# Patient Record
Sex: Female | Born: 2000 | Hispanic: Yes | Marital: Married | Smoking: Never smoker
Health system: Southern US, Community
[De-identification: ages and names within clinical notes are randomized; demographics above are authoritative.]

## PROBLEM LIST (undated history)

## (undated) DIAGNOSIS — Z789 Other specified health status: Secondary | ICD-10-CM

## (undated) HISTORY — DX: Other specified health status: Z78.9

## (undated) HISTORY — PX: NO PAST SURGERIES: SHX2092

---
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
## 2020-03-08 ENCOUNTER — Other Ambulatory Visit: Payer: Self-pay

## 2020-03-08 ENCOUNTER — Ambulatory Visit
Admission: EM | Admit: 2020-03-08 | Discharge: 2020-03-08 | Disposition: A | Discharge status: Home / Self Care | Payer: Medicaid Other | Attending: Family Medicine | Admitting: Family Medicine

## 2020-03-08 DIAGNOSIS — B349 Viral infection, unspecified: Secondary | ICD-10-CM | POA: Diagnosis not present

## 2020-03-08 DIAGNOSIS — Z1152 Encounter for screening for COVID-19: Secondary | ICD-10-CM

## 2020-03-08 DIAGNOSIS — R112 Nausea with vomiting, unspecified: Secondary | ICD-10-CM

## 2020-03-08 MED ORDER — ONDANSETRON HCL 4 MG PO TABS: 4.0000 mg | ORAL_TABLET | Freq: Three times a day (TID) | ORAL | 0 refills | Status: DC | PRN

## 2020-03-08 MED ORDER — BENZONATATE 100 MG PO CAPS: 100.0000 mg | ORAL_CAPSULE | Freq: Three times a day (TID) | ORAL | 0 refills | Status: DC | PRN

## 2020-03-08 NOTE — ED Triage Notes (Signed)
Pt presents with c/o cough for past week, had some vomiting yesterday

## 2020-03-08 NOTE — Discharge Instructions (Addendum)
Hydrate well with fluids.  Benzonatate 3 times daily as needed for coughing.  Take Zofran every 8 hours as needed for nausea.  Tylenol and ibuprofen as needed for fever.  Your results will be available within 48 hours

## 2020-03-09 NOTE — ED Provider Notes (Signed)
[facility]    CSN: [id number] Arrival date & time: 03/08/20  1512      History   Chief Complaint Chief Complaint  Patient presents with  . Cough    HPI Heidi Dean is a 19 y.o. female.   HPI Patient presents today with viral symptoms if cough, nausea, subjective fever x 7 days. Concern that she has flu. Reports symptoms worsened after returning to work last night. She has not taken any medications for her symptoms. Reports vomiting mucus following an episode of coughing. Afebrile at present. Denies abdominal pain, SOB, wheezing, or chest pain. Past Medical History:  Diagnosis Date  . No pertinent past medical history     Patient Active Problem List   Diagnosis Date Noted  . Right upper quadrant pain 09/06/2018    Past Surgical History:  Procedure Laterality Date  . NO PAST SURGERIES      OB History   No obstetric history on file.      Home Medications    Prior to Admission medications   Medication Sig Start Date End Date Taking? Authorizing Provider  benzonatate (TESSALON) 100 MG capsule Take 1-2 capsules (100-200 mg total) by mouth 3 (three) times daily as needed for cough. 03/08/20   Bing Neighbors, FNP  ondansetron (ZOFRAN) 4 MG tablet Take 1 tablet (4 mg total) by mouth every 8 (eight) hours as needed for nausea or vomiting. 03/08/20   Bing Neighbors, FNP    Family History No family history on file.  Social History Social History   Tobacco Use  . Smoking status: Never Smoker  . Smokeless tobacco: Never Used  Substance Use Topics  . Alcohol use: Never  . Drug use: Never     Allergies   Patient has no known allergies.   Review of Systems Review of Systems Pertinent negatives listed in HPI  Physical Exam Triage Vital Signs ED Triage Vitals [03/08/20 1649]  Enc Vitals Group     BP 123/76     Pulse Rate 63     Resp 20     Temp 98.9 F (37.2 C)     Temp src      SpO2 98 %     Weight      Height       Head Circumference      Peak Flow      Pain Score 0     Pain Loc      Pain Edu?      Excl. in GC?    No data found.  Updated Vital Signs BP 123/76   Pulse 63   Temp 98.9 F (37.2 C)   Resp 20   LMP  (LMP Unknown)   SpO2 98%   Visual Acuity Right Eye Distance:   Left Eye Distance:   Bilateral Distance:    Right Eye Near:   Left Eye Near:    Bilateral Near:     Physical Exam General appearance: alert, well developed, well nourished Head: Normocephalic, without obvious abnormality, atraumatic ENT: External ears normal, nares patent, oropharynx normal Respiratory: Respirations even and unlabored, normal respiratory rate Heart: rate and rhythm normal. No gallop or murmurs noted on exam  Abdomen: BS +, no distention, no rebound tenderness, or no mass Extremities: No gross deformities Skin: Skin color, texture, turgor normal. No rashes seen  Psych: Appropriate mood and affect.  UC Treatments / Results  Labs (all labs ordered are listed, but only abnormal results are displayed) Labs Reviewed  COVID-19, FLU A+B NAA    EKG   Radiology No results found.  Procedures Procedures (including critical care time)  Medications Ordered in UC Medications - No data to display  Initial Impression / Assessment and Plan / UC Course  I have reviewed the triage vital signs and the nursing notes.  Pertinent labs & imaging results that were available during my care of the patient were reviewed by me and considered in my medical decision making (see chart for details).    Respiratory panel pending. Symptom management warranted only. Benzonatate PRN for cough and Zofran PRN. Quarantine until results are known. Final Clinical Impressions(s) / UC Diagnoses   Final diagnoses:  Viral illness  Nausea and vomiting, intractability of vomiting not specified, unspecified vomiting type     Discharge Instructions     Hydrate well with fluids.  Benzonatate 3 times daily as needed for  coughing.  Take Zofran every 8 hours as needed for nausea.  Tylenol and ibuprofen as needed for fever.  Your results will be available within 48 hours   ED Prescriptions    Medication Sig Dispense Auth. Provider   ondansetron (ZOFRAN) 4 MG tablet Take 1 tablet (4 mg total) by mouth every 8 (eight) hours as needed for nausea or vomiting. 10 tablet Bing Neighbors, FNP   benzonatate (TESSALON) 100 MG capsule Take 1-2 capsules (100-200 mg total) by mouth 3 (three) times daily as needed for cough. 40 capsule Bing Neighbors, FNP     PDMP not reviewed this encounter.   Bing Neighbors, FNP 03/09/20 [phone]

## 2020-03-11 LAB — COVID-19, FLU A+B NAA
Influenza A, NAA: NOT DETECTED
Influenza B, NAA: NOT DETECTED
SARS-CoV-2, NAA: NOT DETECTED

## 2021-03-30 IMAGING — CT CT ABDOMEN AND PELVIS WITH CONTRAST
2 of 4 series · 15 of 46 positions shown, 17 images · IV contrast (Isovue)
Comparison: None.

CLINICAL DATA: Periumbilical pain for the past 2 days.

EXAM:
CT ABDOMEN AND PELVIS WITH CONTRAST
TECHNIQUE: Multidetector CT imaging of the abdomen and pelvis was performed
using the standard protocol following bolus administration of
intravenous contrast.
CONTRAST:  100mL OMNIPAQUE IOHEXOL 300 MG/ML  SOLN

[Series 2: axial st · axial · 0.59mm/px · z∈[-552,-147]mm · 12 of 93 slices shown, 14 images]
[im 6/93  soft-tissue]
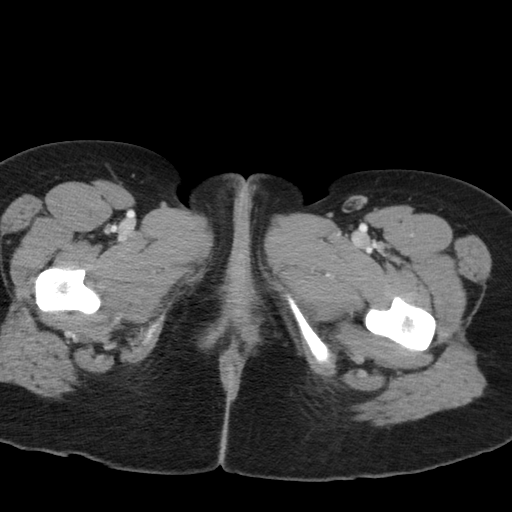
[im 6/93  bone]
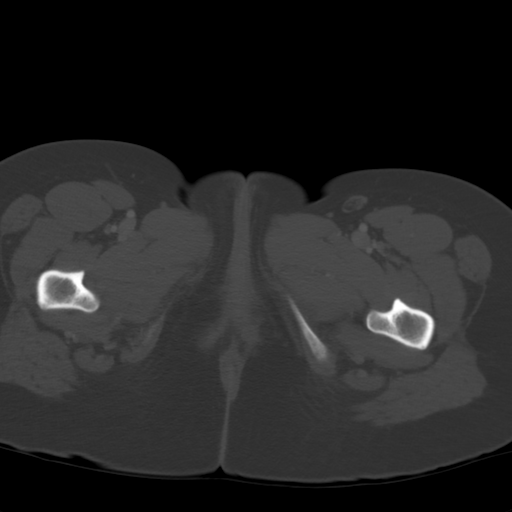
[im 17/93  soft-tissue]
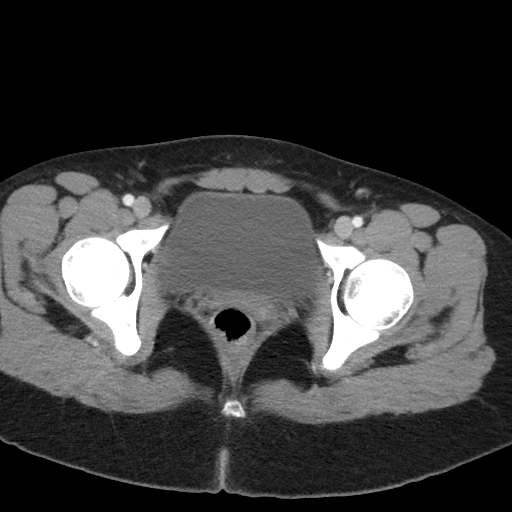
[im 22/93  soft-tissue]
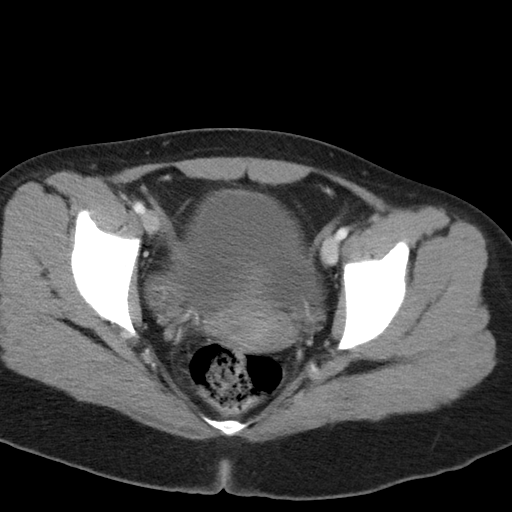
[im 28/93  soft-tissue]
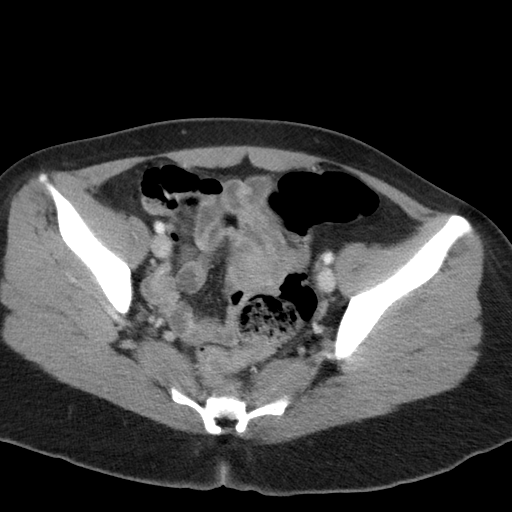
[im 38/93  soft-tissue]
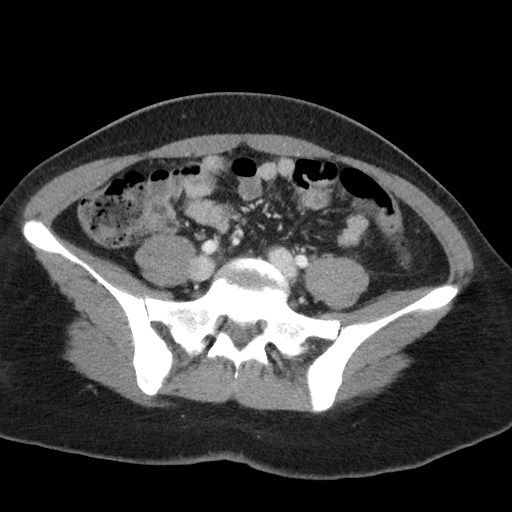
[im 44/93  soft-tissue]
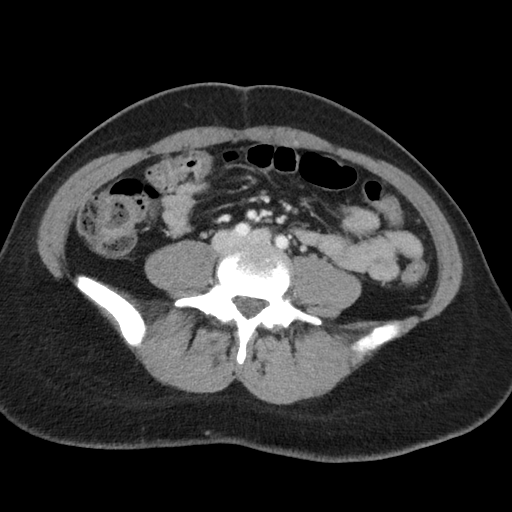
[im 49/93  soft-tissue]
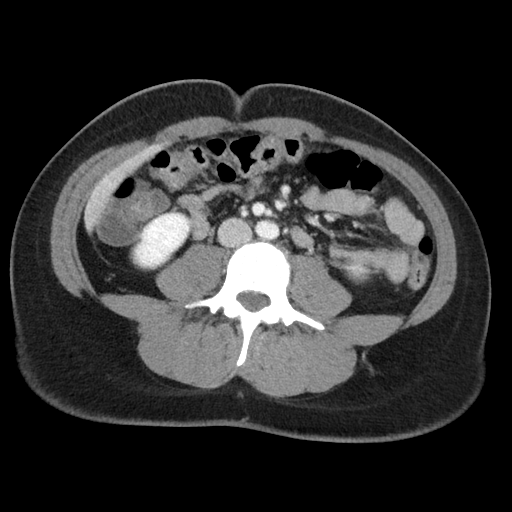
[im 60/93  soft-tissue]
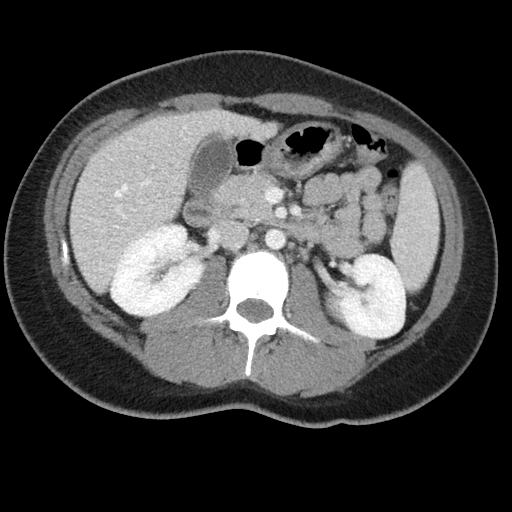
[im 65/93  soft-tissue]
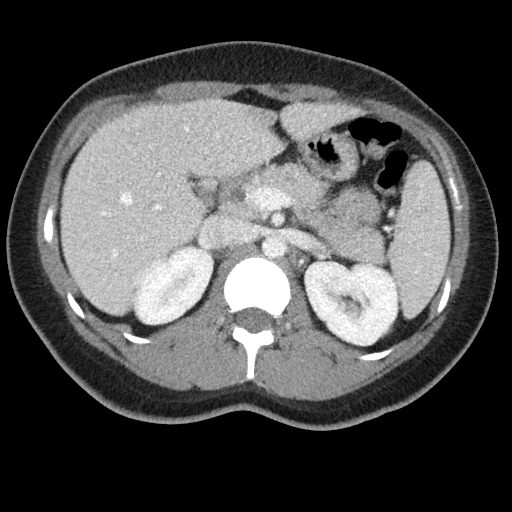
[im 65/93  bone]
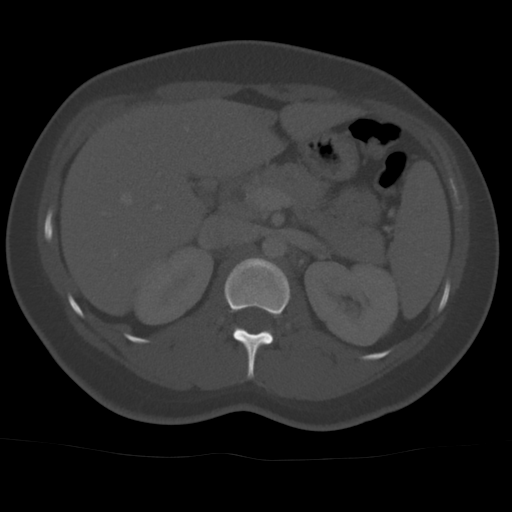
[im 71/93  soft-tissue]
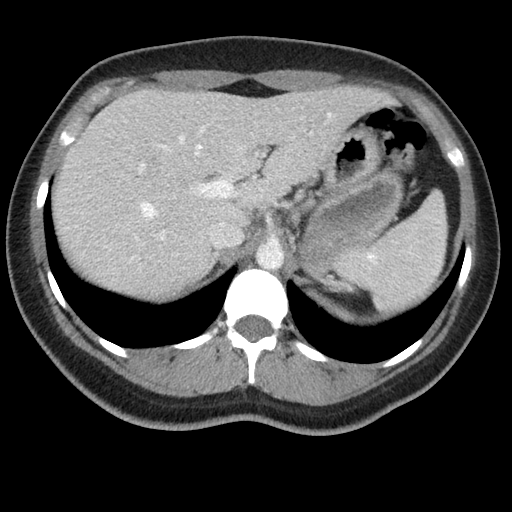
[im 82/93  soft-tissue]
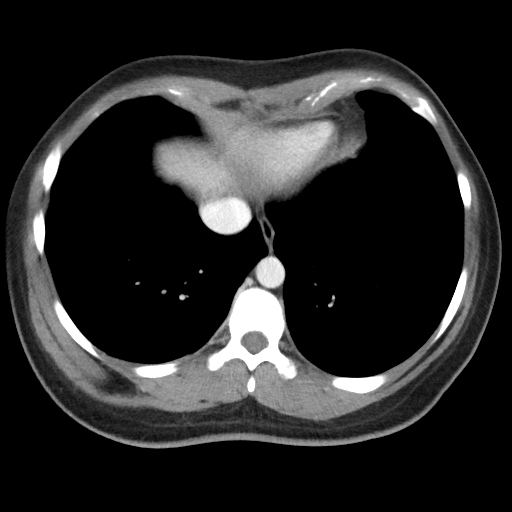
[im 87/93  soft-tissue]
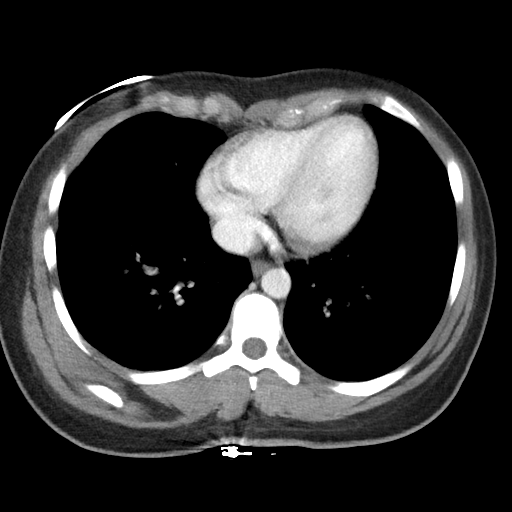

[Series 5: coronal st · coronal · 0.54mm/px · 3 of 71 slices shown]
[im 24/71  soft-tissue]
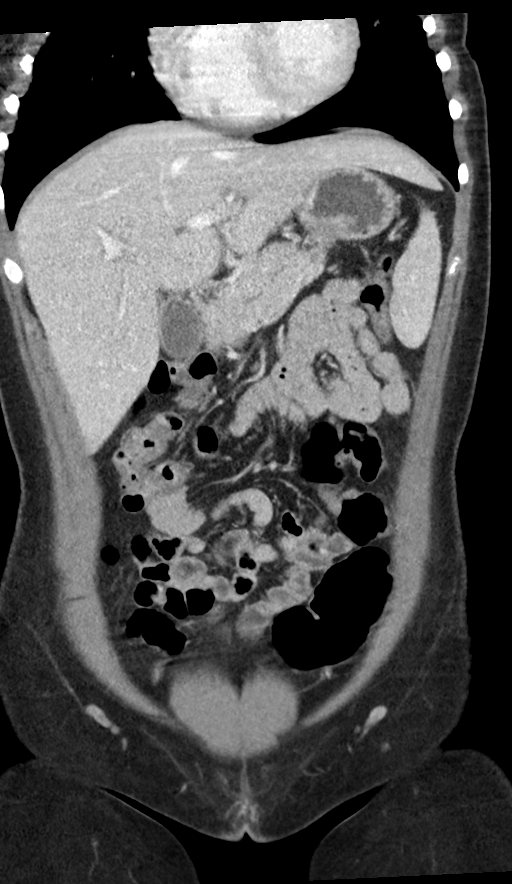
[im 32/71  soft-tissue]
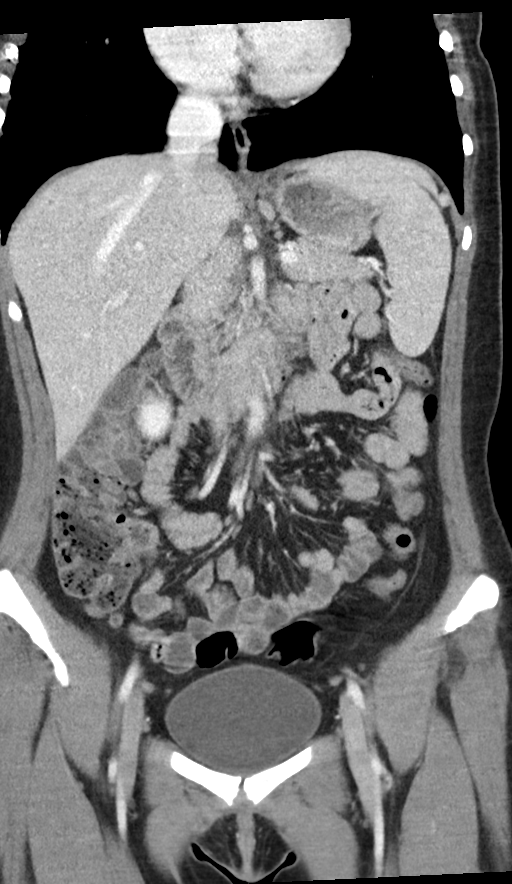
[im 39/71  soft-tissue]
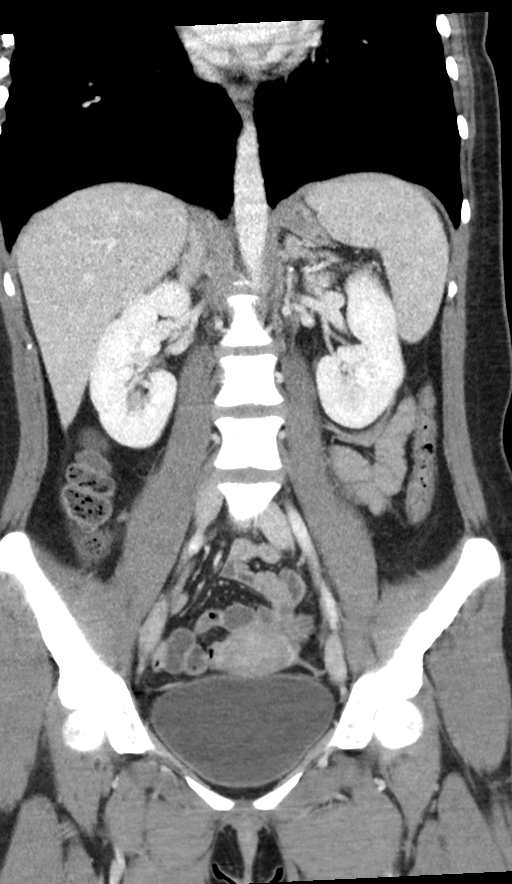

[15 of 46 positions shown; findings below may reference images not displayed]

FINDINGS: Lower chest: No acute abnormality.

Hepatobiliary: No focal liver abnormality. The gallbladder wall is
at the upper limits of normal in thickness. No gallstones,
pericholecystic inflammatory changes, or biliary dilatation.

Pancreas: Unremarkable. No pancreatic ductal dilatation or
surrounding inflammatory changes.

Spleen: Normal in size without focal abnormality.

Adrenals/Urinary Tract: Adrenal glands are unremarkable. Kidneys are
normal, without renal calculi, focal lesion, or hydronephrosis.
Bladder is unremarkable.

Stomach/Bowel: Stomach is within normal limits. Appendix appears
normal. No evidence of bowel wall thickening, distention, or
inflammatory changes.

Vascular/Lymphatic: No significant vascular findings are present. No
enlarged abdominal or pelvic lymph nodes.

Reproductive: Uterus and bilateral adnexa are unremarkable.

Other: No abdominal wall hernia or abnormality. No abdominopelvic
ascites. No pneumoperitoneum.

Musculoskeletal: No acute or significant osseous findings.
IMPRESSION: 1.  No acute intra-abdominal process.

## 2021-03-30 IMAGING — US ULTRASOUND ABDOMEN LIMITED
1 series · 14 of 25 positions shown · non-contrast
Comparison: CT abdomen and pelvis September 04, 2018

CLINICAL DATA: Elevated liver enzymes

EXAM:
ULTRASOUND ABDOMEN LIMITED RIGHT UPPER QUADRANT

[Series 1: ultrasound abdomen limited · 14 of 38 slices shown]
[im 1/38]
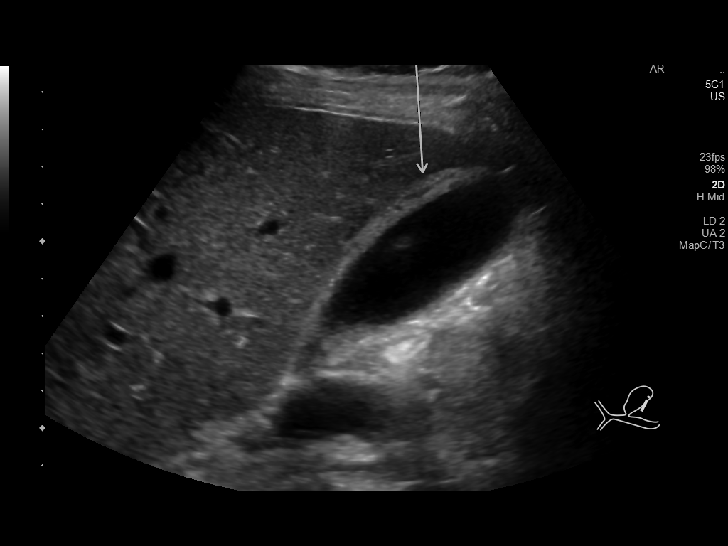
[im 4/38]
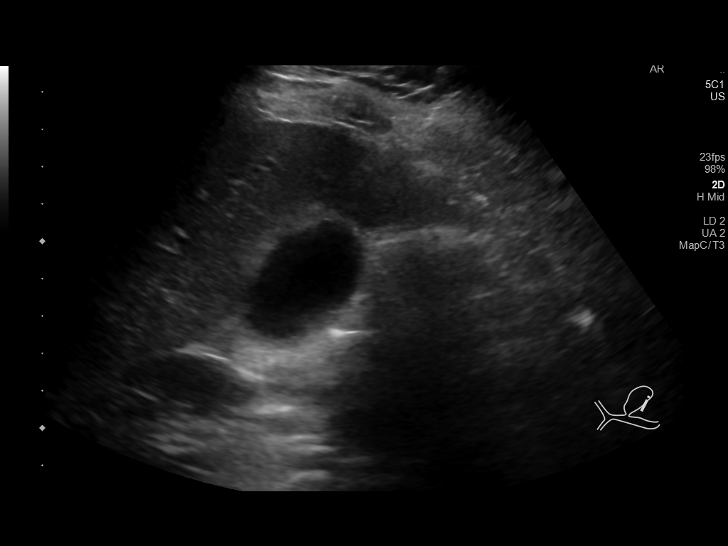
[im 7/38]
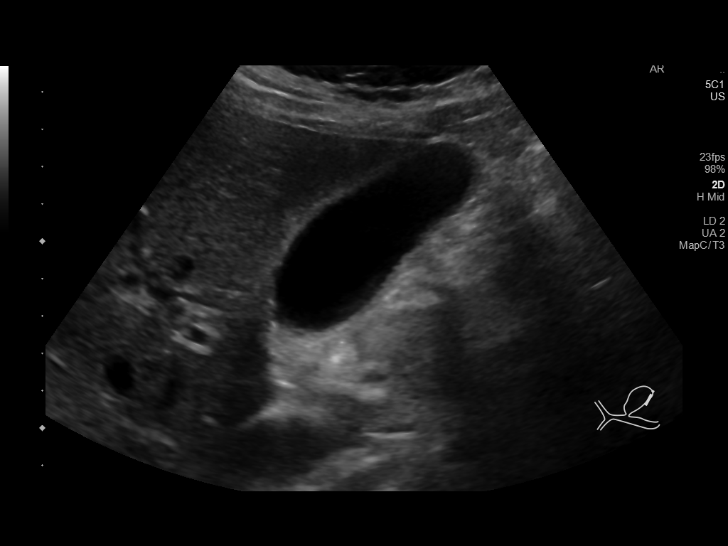
[im 10/38]
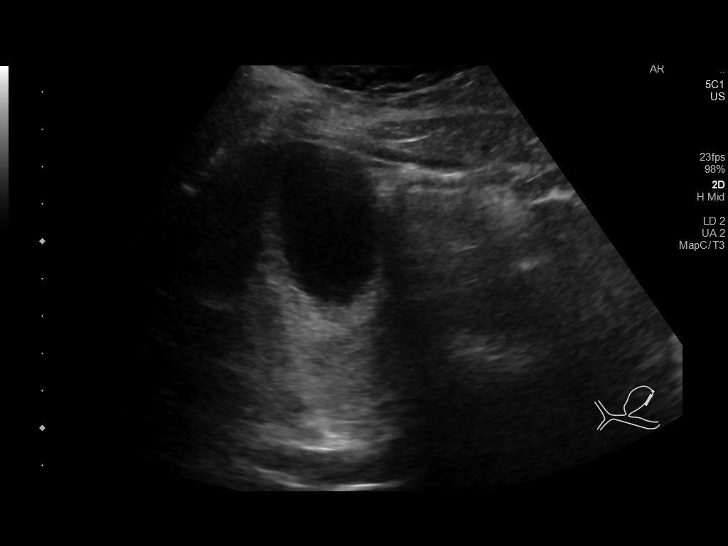
[im 13/38]
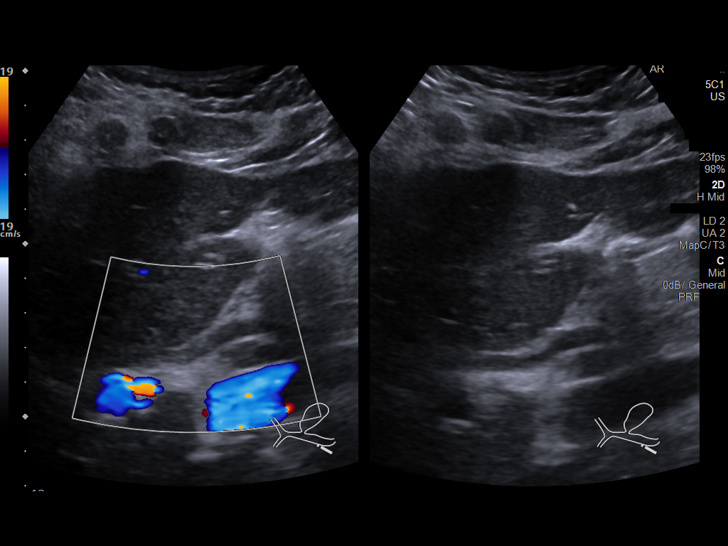
[im 14/38]
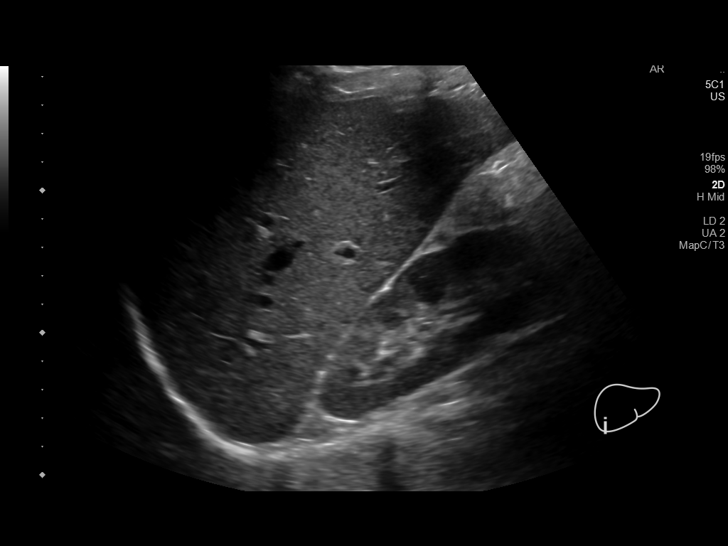
[im 17/38]
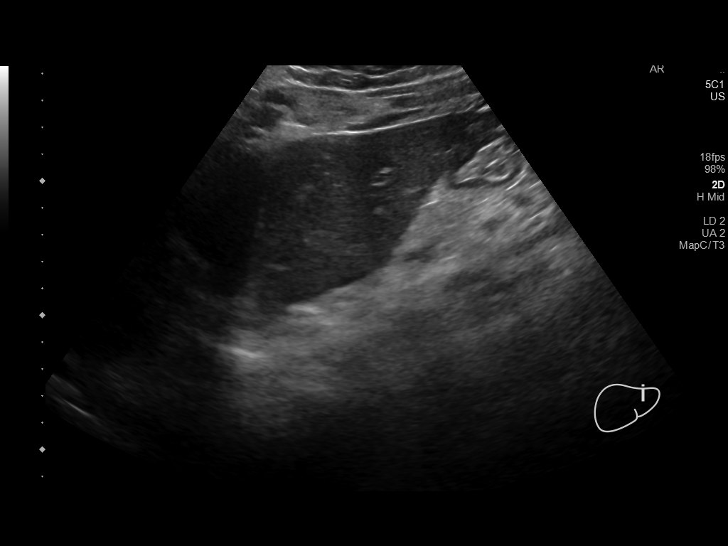
[im 21/38]
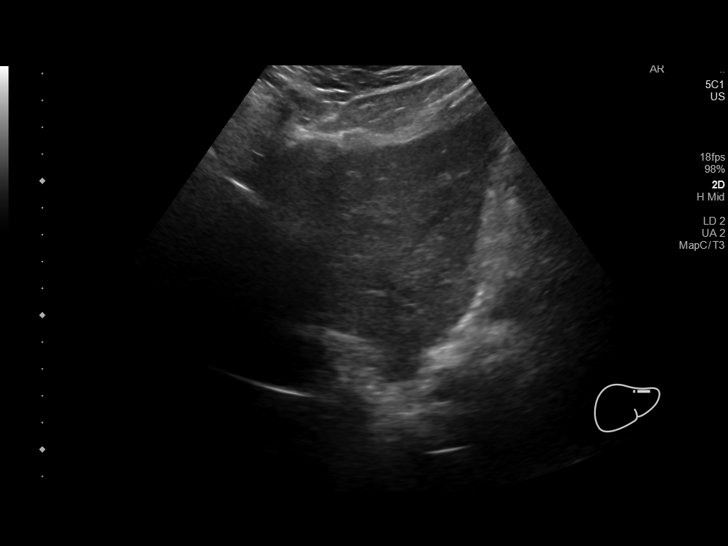
[im 24/38]
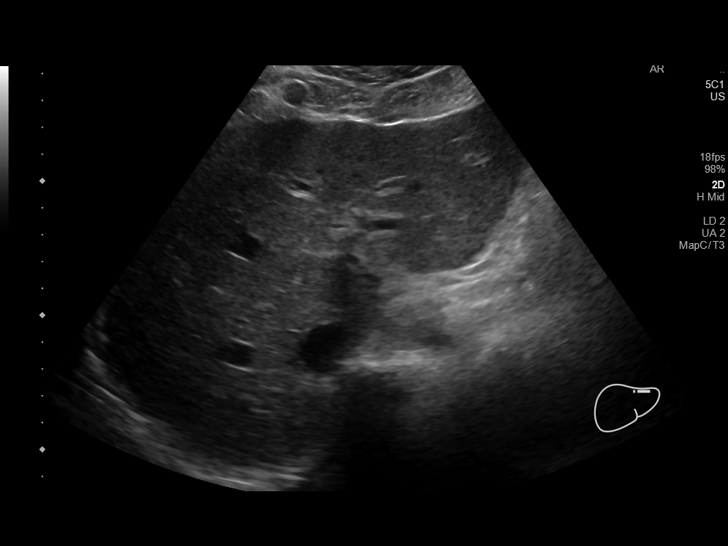
[im 25/38]
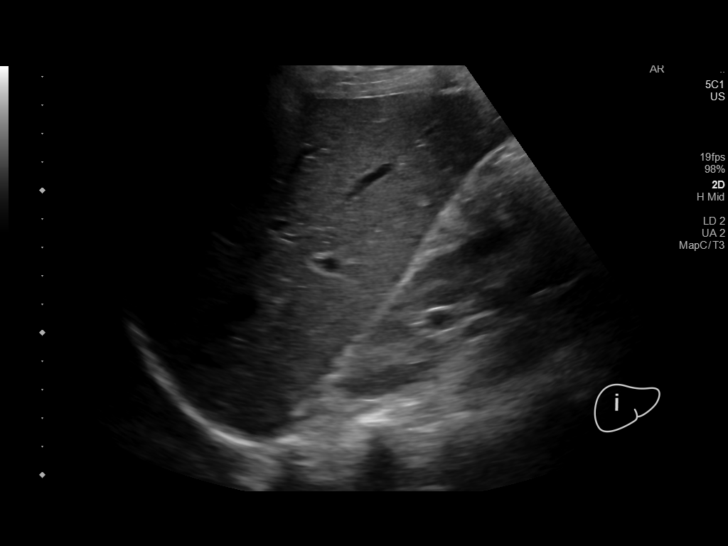
[im 28/38]
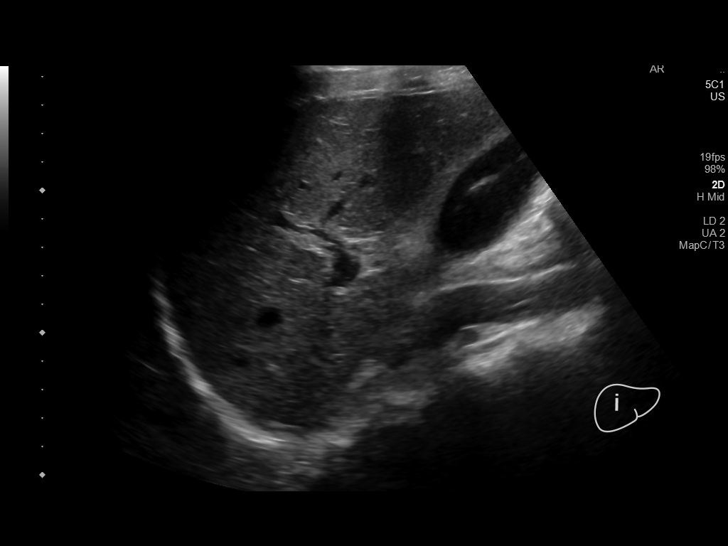
[im 31/38]
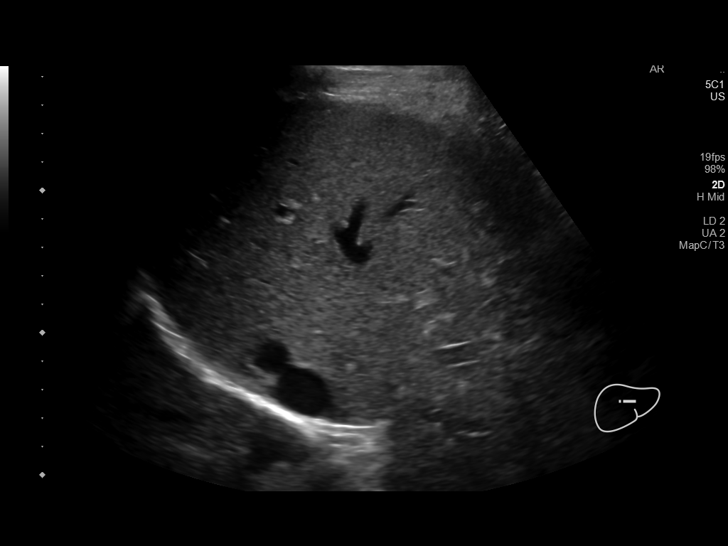
[im 34/38]
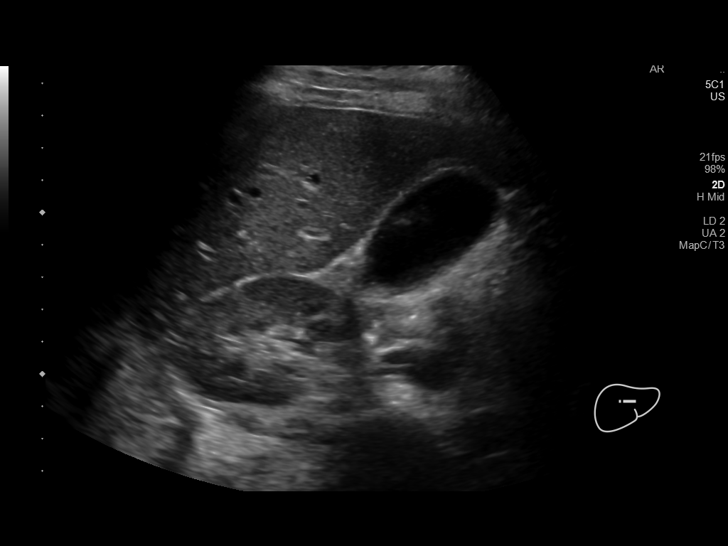
[im 38/38]
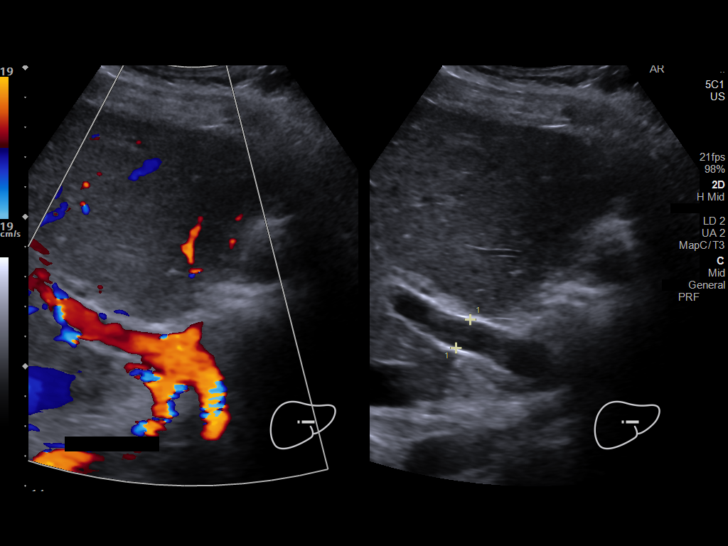

[14 of 25 positions shown; findings below may reference images not displayed]

FINDINGS: Gallbladder:

No gallstones evident. There is gallbladder wall thickening with
mild edema in the gallbladder wall. There is slight pericholecystic
fluid. No sonographic Murphy sign noted by sonographer.

Common bile duct:

Diameter: 6 mm. No intrahepatic or extrahepatic biliary duct
dilatation.

Liver:

No focal lesion identified. Within normal limits in parenchymal
echogenicity. Portal vein is patent on color Doppler imaging with
normal direction of blood flow towards the liver.
IMPRESSION: Thickened gallbladder wall with mild edema and mild pericholecystic
fluid. No gallstones evident. The appearance is concerning for
acalculus cholecystitis. It may be prudent to consider nuclear
medicine hepatobiliary imaging study to assess for cystic duct
patency.

Study otherwise unremarkable.

## 2021-04-15 IMAGING — NM NUCLEAR MEDICINE HEPATOBILIARY IMAGING WITH GALLBLADDER EF
2 series · 12 of 12 positions shown · non-contrast
Comparison: None.

CLINICAL DATA: Right upper quadrant pain

EXAM:
NUCLEAR MEDICINE HEPATOBILIARY IMAGING WITH GALLBLADDER EF
VIEWS:
Anterior right upper quadrant
RADIOPHARMACEUTICALS:  5.4 mCi Hc-TTm  Choletec IV

[Series 1: biliary · 3.25mm/px · 6 of 60 frames shown]
[frame 6/60]
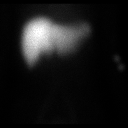
[frame 16/60]
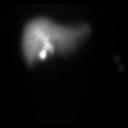
[frame 26/60]
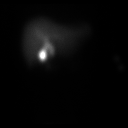
[frame 36/60]
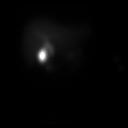
[frame 46/60]
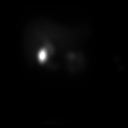
[frame 56/60]
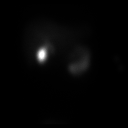

[Series 2: gbef · 3.25mm/px · 6 of 60 frames shown]
[frame 6/60]
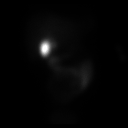
[frame 16/60]
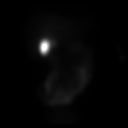
[frame 26/60]
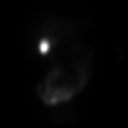
[frame 36/60]
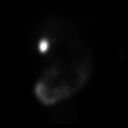
[frame 46/60]
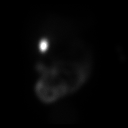
[frame 56/60]
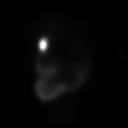

[12 of 12 positions shown; findings below may reference images not displayed]

FINDINGS: Liver uptake of radiotracer is unremarkable. There is prompt
visualization of gallbladder and small bowel, indicating patency of
the cystic and common bile ducts. The patient consumed 8 ounces of
Ensure orally with calculation of the computer generated ejection
fraction of radiotracer from the gallbladder. The patient did not
experience clinical symptoms with the oral Ensure consumption. The
computer generated ejection fraction of radiotracer from the
gallbladder is abnormally low at 30%, normal greater than 33% using
the oral agent.
IMPRESSION: Abnormally low ejection fraction of radiotracer from the
gallbladder, a finding concerning for a degree of biliary
dyskinesia. Cystic and common bile ducts are patent as is evidenced
by visualization of gallbladder and small bowel.

## 2021-08-24 ENCOUNTER — Ambulatory Visit
Admission: EM | Admit: 2021-08-24 | Discharge: 2021-08-24 | Disposition: A | Discharge status: Home / Self Care | Payer: Medicaid Other | Attending: Family Medicine | Admitting: Family Medicine

## 2021-08-24 DIAGNOSIS — J209 Acute bronchitis, unspecified: Secondary | ICD-10-CM

## 2021-08-24 MED ORDER — PROMETHAZINE-DM 6.25-15 MG/5ML PO SYRP: 5.0000 mL | ORAL_SOLUTION | Freq: Four times a day (QID) | ORAL | 0 refills | Status: DC | PRN

## 2021-08-24 MED ORDER — PREDNISONE 20 MG PO TABS: 40.0000 mg | ORAL_TABLET | Freq: Every day | ORAL | 0 refills | Status: DC

## 2021-08-24 MED ORDER — ALBUTEROL SULFATE HFA 108 (90 BASE) MCG/ACT IN AERS: 1.0000 | INHALATION_SPRAY | Freq: Four times a day (QID) | RESPIRATORY_TRACT | 0 refills | Status: DC | PRN

## 2021-08-24 NOTE — ED Provider Notes (Addendum)
[facility]    CSN: [id number] Arrival date & time: 08/24/21  1429      History   Chief Complaint Chief Complaint  Patient presents with   Cough    HPI Heidi Dean is a 21 y.o. female.   Medical interpreter declined today, family member who is with her wishes to translate for her with her consent.  She states she has had cold type symptoms for about a week now, those have all resolved but she is left with a hacking persistent cough.  She denies wheezing, chest pain, shortness of breath, fever, chills, abdominal pain, nausea vomiting or diarrhea.  She has been trying DayQuil, NyQuil and some naturopathic cough medicine from [place] with no relief.  No known history of chronic pulmonary disease.   Past Medical History:  Diagnosis Date   No pertinent past medical history     Patient Active Problem List   Diagnosis Date Noted   Right upper quadrant pain 09/06/2018    Past Surgical History:  Procedure Laterality Date   NO PAST SURGERIES      OB History   No obstetric history on file.      Home Medications    Prior to Admission medications   Medication Sig Start Date End Date Taking? Authorizing Provider  albuterol (VENTOLIN HFA) 108 (90 Base) MCG/ACT inhaler Inhale 1-2 puffs into the lungs every 6 (six) hours as needed for wheezing or shortness of breath. 08/24/21  Yes Volney American, PA-C  predniSONE (DELTASONE) 20 MG tablet Take 2 tablets (40 mg total) by mouth daily with breakfast. 08/24/21  Yes Volney American, PA-C  promethazine-dextromethorphan (PROMETHAZINE-DM) 6.25-15 MG/5ML syrup Take 5 mLs by mouth 4 (four) times daily as needed. 08/24/21  Yes Volney American, PA-C  benzonatate (TESSALON) 100 MG capsule Take 1-2 capsules (100-200 mg total) by mouth 3 (three) times daily as needed for cough. 03/08/20   Scot Jun, FNP  ondansetron (ZOFRAN) 4 MG tablet Take 1 tablet (4 mg total) by mouth every 8 (eight) hours as  needed for nausea or vomiting. 03/08/20   Scot Jun, FNP    Family History No family history on file.  Social History Social History   Tobacco Use   Smoking status: Never   Smokeless tobacco: Never  Vaping Use   Vaping Use: Never used  Substance Use Topics   Alcohol use: Yes   Drug use: Never     Allergies   Patient has no known allergies.   Review of Systems Review of Systems Per HPI  Physical Exam Triage Vital Signs ED Triage Vitals  Enc Vitals Group     BP 08/24/21 1442 122/80     Pulse Rate 08/24/21 1442 81     Resp 08/24/21 1442 20     Temp 08/24/21 1442 98.8 F (37.1 C)     Temp Source 08/24/21 1442 Oral     SpO2 08/24/21 1442 98 %     Weight --      Height --      Head Circumference --      Peak Flow --      Pain Score 08/24/21 1441 0     Pain Loc --      Pain Edu? --      Excl. in Nitro? --    No data found.  Updated Vital Signs BP 122/80 (BP Location: Right Arm)   Pulse 81   Temp 98.8 F (37.1 C) (Oral)  Resp 20   LMP 07/14/2021 (Approximate)   SpO2 98%   Visual Acuity Right Eye Distance:   Left Eye Distance:   Bilateral Distance:    Right Eye Near:   Left Eye Near:    Bilateral Near:     Physical Exam Vitals and nursing note reviewed.  Constitutional:      Appearance: Normal appearance.  HENT:     Head: Atraumatic.     Right Ear: Tympanic membrane and external ear normal.     Left Ear: Tympanic membrane and external ear normal.     Nose: Nose normal.     Mouth/Throat:     Mouth: Mucous membranes are moist.     Pharynx: Posterior oropharyngeal erythema present.  Eyes:     Extraocular Movements: Extraocular movements intact.     Conjunctiva/sclera: Conjunctivae normal.  Cardiovascular:     Rate and Rhythm: Normal rate and regular rhythm.     Heart sounds: Normal heart sounds.  Pulmonary:     Effort: Pulmonary effort is normal.     Breath sounds: Normal breath sounds. No wheezing or rales.  Musculoskeletal:         General: Normal range of motion.     Cervical back: Normal range of motion and neck supple.  Skin:    General: Skin is warm and dry.  Neurological:     Mental Status: She is alert and oriented to person, place, and time.     Motor: No weakness.     Gait: Gait normal.  Psychiatric:        Mood and Affect: Mood normal.        Thought Content: Thought content normal.   UC Treatments / Results  Labs (all labs ordered are listed, but only abnormal results are displayed) Labs Reviewed - No data to display  EKG  Radiology No results found.  Procedures Procedures (including critical care time)  Medications Ordered in UC Medications - No data to display  Initial Impression / Assessment and Plan / UC Course  I have reviewed the triage vital signs and the nursing notes.  Pertinent labs & imaging results that were available during my care of the patient were reviewed by me and considered in my medical decision making (see chart for details).     Vital signs and exam overall very reassuring today.  Suspect postviral bronchitis.  Treat with prednisone, albuterol, Phenergan DM and continue supportive over-the-counter remedies.  Return for acutely worsening symptoms.  When asked, she declines any chance of pregnancy.  Final Clinical Impressions(s) / UC Diagnoses   Final diagnoses:  Acute bronchitis, unspecified organism   Discharge Instructions   None    ED Prescriptions     Medication Sig Dispense Auth. Provider   predniSONE (DELTASONE) 20 MG tablet Take 2 tablets (40 mg total) by mouth daily with breakfast. 6 tablet Volney American, PA-C   promethazine-dextromethorphan (PROMETHAZINE-DM) 6.25-15 MG/5ML syrup Take 5 mLs by mouth 4 (four) times daily as needed. 100 mL Volney American, PA-C   albuterol (VENTOLIN HFA) 108 (90 Base) MCG/ACT inhaler Inhale 1-2 puffs into the lungs every 6 (six) hours as needed for wheezing or shortness of breath. 18 g Volney American, [place]      PDMP not reviewed this encounter.   Volney American, [place] 08/24/21 [place], [place], [place] 08/24/21 [phone]

## 2021-08-24 NOTE — ED Triage Notes (Signed)
Pt states she started coughing and head cold last Thursday  Pt states she is better now but cant get rid of the cough  Pt states she tried OTC meds but no relief from the cough  Denies Fever

## 2022-10-31 ENCOUNTER — Ambulatory Visit
Admission: EM | Admit: 2022-10-31 | Discharge: 2022-10-31 | Disposition: A | Discharge status: Home / Self Care | Payer: Medicaid Other | Attending: Family Medicine | Admitting: Family Medicine

## 2022-10-31 DIAGNOSIS — J069 Acute upper respiratory infection, unspecified: Secondary | ICD-10-CM | POA: Diagnosis not present

## 2022-10-31 DIAGNOSIS — N926 Irregular menstruation, unspecified: Secondary | ICD-10-CM | POA: Diagnosis not present

## 2022-10-31 MED ORDER — ALBUTEROL SULFATE HFA 108 (90 BASE) MCG/ACT IN AERS: 2.0000 | INHALATION_SPRAY | RESPIRATORY_TRACT | 0 refills | Status: DC | PRN

## 2022-10-31 MED ORDER — PROMETHAZINE-DM 6.25-15 MG/5ML PO SYRP: 5.0000 mL | ORAL_SOLUTION | Freq: Four times a day (QID) | ORAL | 0 refills | Status: DC | PRN

## 2022-10-31 NOTE — ED Provider Notes (Signed)
[facility]    CSN: [id number] Arrival date & time: 10/31/22  4098      History   Chief Complaint No chief complaint on file.   HPI Heidi Dean is a 22 y.o. female.   Presenting today with 2-day history of cough, sinus drainage.  Denies fever, chills, chest pain, shortness of breath, abdominal pain, nausea vomiting or diarrhea.  States she sometimes get into coughing fits and finds it hard to catch her breath.  History of bronchitis, no known history of asthma or smoking.  So far trying DayQuil and NyQuil with only mild relief.  No known sick contacts recently.  She is also complaining of irregular periods the past 3 months.  States her periods are coming on time, but she is having a second 4 to 5-day stretch of bleeding about halfway through her cycles which has never happened to her before.  Bleeding is not particularly heavy, no significant clots or pelvic pain.  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  Does not have an OB/GYN.  Not on any contraceptives but not concern for pregnancy today and does not wish to check for this.    Past Medical History:  Diagnosis Date   No pertinent past medical history     Patient Active Problem List   Diagnosis Date Noted   Right upper quadrant pain 09/06/2018    Past Surgical History:  Procedure Laterality Date   NO PAST SURGERIES      OB History   No obstetric history on file.      Home Medications    Prior to Admission medications   Medication Sig Start Date End Date Taking? Authorizing Provider  albuterol (VENTOLIN HFA) 108 (90 Base) MCG/ACT inhaler Inhale 2 puffs into the lungs every 4 (four) hours as needed for wheezing or shortness of breath. 10/31/22  Yes Particia Nearing, PA-C  promethazine-dextromethorphan (PROMETHAZINE-DM) 6.25-15 MG/5ML syrup Take 5 mLs by mouth 4 (four) times daily as needed. 10/31/22  Yes Particia Nearing,  PA-C  albuterol (VENTOLIN HFA) 108 (90 Base) MCG/ACT inhaler Inhale 1-2 puffs into the lungs every 6 (six) hours as needed for wheezing or shortness of breath. 08/24/21   Particia Nearing, PA-C  benzonatate (TESSALON) 100 MG capsule Take 1-2 capsules (100-200 mg total) by mouth 3 (three) times daily as needed for cough. 03/08/20   Bing Neighbors, NP  ondansetron (ZOFRAN) 4 MG tablet Take 1 tablet (4 mg total) by mouth every 8 (eight) hours as needed for nausea or vomiting. 03/08/20   Bing Neighbors, NP  predniSONE (DELTASONE) 20 MG tablet Take 2 tablets (40 mg total) by mouth daily with breakfast. 08/24/21   Particia Nearing, PA-C  promethazine-dextromethorphan (PROMETHAZINE-DM) 6.25-15 MG/5ML syrup Take 5 mLs by mouth 4 (four) times daily as needed. 08/24/21   Particia Nearing, PA-C    Family History History reviewed. No pertinent family history.  Social History Social History   Tobacco Use   Smoking status: Never   Smokeless tobacco: Never  Vaping Use   Vaping status: Never Used  Substance Use Topics   Alcohol use: Yes   Drug use: Never     Allergies   Patient has no known allergies.   Review of Systems Review of Systems Per HPI  Physical Exam Triage Vital Signs ED Triage Vitals [10/31/22 1000]  Encounter Vitals Group     BP 116/78  Systolic BP Percentile      Diastolic BP Percentile      Pulse Rate 76     Resp 18     Temp 98.5 F (36.9 C)     Temp Source Oral     SpO2 98 %     Weight      Height      Head Circumference      Peak Flow      Pain Score 0     Pain Loc      Pain Education      Exclude from Growth Chart    No data found.  Updated Vital Signs BP 116/78 (BP Location: Right Arm)   Pulse 76   Temp 98.5 F (36.9 C) (Oral)   Resp 18   LMP 10/18/2022 (Approximate)   SpO2 98%   Visual Acuity Right Eye Distance:   Left Eye Distance:   Bilateral Distance:    Right Eye Near:   Left Eye Near:    Bilateral Near:      Physical Exam Vitals and nursing note reviewed.  Constitutional:      Appearance: Normal appearance. She is not ill-appearing.  HENT:     Head: Atraumatic.     Nose: Nose normal.     Mouth/Throat:     Mouth: Mucous membranes are moist.     Pharynx: Oropharynx is clear.  Eyes:     Extraocular Movements: Extraocular movements intact.     Conjunctiva/sclera: Conjunctivae normal.  Cardiovascular:     Rate and Rhythm: Normal rate and regular rhythm.     Heart sounds: Normal heart sounds.  Pulmonary:     Effort: Pulmonary effort is normal. No respiratory distress.     Breath sounds: Normal breath sounds. No wheezing or rales.  Abdominal:     General: Bowel sounds are normal. There is no distension.     Palpations: Abdomen is soft.     Tenderness: There is no abdominal tenderness. There is no right CVA tenderness, left CVA tenderness or guarding.  Genitourinary:    Comments: GU exam deferred with shared decision making Musculoskeletal:        General: Normal range of motion.     Cervical back: Normal range of motion and neck supple.  Skin:    General: Skin is warm and dry.  Neurological:     Mental Status: She is alert and oriented to person, place, and time.  Psychiatric:        Mood and Affect: Mood normal.        Thought Content: Thought content normal.        Judgment: Judgment normal.      UC Treatments / Results  Labs (all labs ordered are listed, but only abnormal results are displayed) Labs Reviewed - No data to display  EKG   Radiology No results found.  Procedures Procedures (including critical care time)  Medications Ordered in UC Medications - No data to display  Initial Impression / Assessment and Plan / UC Course  I have reviewed the triage vital signs and the nursing notes.  Pertinent labs & imaging results that were available during my care of the patient were reviewed by me and considered in my medical decision making (see chart for  details).     Will provide albuterol inhaler and cough syrup to help with coughing fits and discussed supportive over-the-counter medications, home care additionally.  No red flag findings on exam for this today.  Regarding her  abnormal menses, recommended OB/GYN resources for follow-up.  Bleeding is controlled so we will forego CBC today or starting medication to halt extra bleeding.  Discussed follow-up precautions for uncontrolled episodes of bleeding, weakness, dizziness associated Final Clinical Impressions(s) / UC Diagnoses   Final diagnoses:  Viral URI with cough  Abnormal menstruation     Discharge Instructions      I have sent over an albuterol inhaler and cough syrup to help with your cough symptoms.  You may also continue taking DayQuil, NyQuil, Mucinex and staying well-hydrated.  Regarding your abnormal periods, I have placed resources for a local OB/GYN clinic that you may call to schedule a new patient appointment.    ED Prescriptions     Medication Sig Dispense Auth. Provider   albuterol (VENTOLIN HFA) 108 (90 Base) MCG/ACT inhaler Inhale 2 puffs into the lungs every 4 (four) hours as needed for wheezing or shortness of breath. 18 g Roosvelt Maser [place], [place]   promethazine-dextromethorphan (PROMETHAZINE-DM) 6.25-15 MG/5ML syrup Take 5 mLs by mouth 4 (four) times daily as needed. 100 mL Particia Nearing, [place]      PDMP not reviewed this encounter.   Particia Nearing, [place] 10/31/22 1045

## 2022-10-31 NOTE — Discharge Instructions (Signed)
I have sent over an albuterol inhaler and cough syrup to help with your cough symptoms.  You may also continue taking DayQuil, NyQuil, Mucinex and staying well-hydrated.  Regarding your abnormal periods, I have placed resources for a local OB/GYN clinic that you may call to schedule a new patient appointment.

## 2022-10-31 NOTE — ED Triage Notes (Signed)
Pt has had a cough x 2 days. States she has been bleeding every 2 weeks for a MP.   Does not have GYN

## 2022-11-23 ENCOUNTER — Encounter: Payer: Self-pay | Admitting: Obstetrics and Gynecology

## 2022-11-23 ENCOUNTER — Ambulatory Visit: Payer: Medicaid Other | Admitting: Obstetrics and Gynecology

## 2022-11-23 ENCOUNTER — Other Ambulatory Visit (HOSPITAL_COMMUNITY)
Admission: RE | Admit: 2022-11-23 | Discharge: 2022-11-23 | Disposition: A | Discharge status: Home / Self Care | Payer: Medicaid Other | Source: Ambulatory Visit | Attending: Obstetrics and Gynecology | Admitting: Obstetrics and Gynecology

## 2022-11-23 VITALS — BP 106/68 | HR 71 | Ht 62.0 in | Wt 170.0 lb

## 2022-11-23 DIAGNOSIS — N926 Irregular menstruation, unspecified: Secondary | ICD-10-CM | POA: Insufficient documentation

## 2022-11-23 DIAGNOSIS — B9689 Other specified bacterial agents as the cause of diseases classified elsewhere: Secondary | ICD-10-CM

## 2022-11-23 DIAGNOSIS — Z124 Encounter for screening for malignant neoplasm of cervix: Secondary | ICD-10-CM

## 2022-11-23 DIAGNOSIS — N760 Acute vaginitis: Secondary | ICD-10-CM

## 2022-11-23 LAB — CERVICOVAGINAL ANCILLARY ONLY
Bacterial Vaginitis (gardnerella): POSITIVE — AB
Candida Glabrata: NEGATIVE
Candida Vaginitis: NEGATIVE
Chlamydia: NEGATIVE
Comment: NEGATIVE
Comment: NEGATIVE
Comment: NEGATIVE
Comment: NEGATIVE
Comment: NEGATIVE
Comment: NORMAL
Neisseria Gonorrhea: NEGATIVE
Trichomonas: NEGATIVE

## 2022-11-23 LAB — POCT URINE PREGNANCY: Preg Test, Ur: NEGATIVE

## 2022-11-23 NOTE — Progress Notes (Signed)
   GYNECOLOGY PROGRESS NOTE  History:  22 y.o. No obstetric history on file. presents to [facility] office today for problem gyn visit. She reports irregular menses.  She denies h/a, dizziness, shortness of breath, n/v, or fever/chills.    Reports the past two months irregular,  coming on every two weeks sometimes light bleed. sometimes heavy. Bleeding last 4-5 days. Denies vaginal pain, itching/burning, irritation, abmormal d/c.  Prior to the past two months, reports every 28-32 days. LMP 8/9. Same partner for 4 years. Denies change in medication or life stressor. Would like to get pregnant in the next couple months/year.   The following portions of the patient's history were reviewed and updated as appropriate: allergies, current medications, past family history, past medical history, past social history, past surgical history and problem list.   Health Maintenance Due  Topic Date Due   HPV VACCINES (1 - 3-dose series) Never done   HIV Screening  Never done   Hepatitis C Screening  Never done   DTaP/Tdap/Td (1 - Tdap) Never done   PAP-Cervical Cytology Screening  Never done   PAP SMEAR-Modifier  Never done   COVID-19 Vaccine (1 - 2023-24 season) Never done   INFLUENZA VACCINE  10/27/2022     Review of Systems:  Pertinent items are noted in HPI.   Objective:  Physical Exam Blood pressure 106/68, pulse 71, height 5\' 2"  (1.575 m), weight 170 lb (77.1 kg), last menstrual period 11/04/2022. VS reviewed, nursing note reviewed,  Constitutional: well developed, well nourished, no distress HEENT: normocephalic CV: normal rate Pulm/chest wall: normal effort Abdomen: soft Neuro: alert and oriented  Skin: warm, dry Psych: affect normal Pelvic exam: Cervix pink, visually closed, without lesion, vaginal walls and external genitalia normal. Pap smear collected with chaperone present   Assessment & Plan:  1. Irregular menses Discussed differentials to cause irreg cycle. Denies hx of  irregular cycles, and TTC in the next couple of months, prefers no birth control. Discussed check swabs first and see if irreg bleeding continues. Will wait for results to determine follow up - POCT urine pregnancy - Cervicovaginal ancillary only  2. Cervical cancer screening Collected today  - Cytology - PAP( [place])  Return in about 1 year (around 11/23/2023) for Thayer Jew, FNP

## 2022-11-24 MED ORDER — METRONIDAZOLE 500 MG PO TABS: 500.0000 mg | ORAL_TABLET | Freq: Two times a day (BID) | ORAL | 0 refills | Status: AC

## 2022-11-24 NOTE — Addendum Note (Signed)
Addended by: Sue Lush on: 11/24/2022 08:15 AM   Modules accepted: Orders

## 2022-12-01 LAB — CYTOLOGY - PAP

## 2022-12-27 ENCOUNTER — Ambulatory Visit
Admission: EM | Admit: 2022-12-27 | Discharge: 2022-12-27 | Disposition: A | Discharge status: Home / Self Care | Payer: Medicaid Other | Attending: Family Medicine | Admitting: Family Medicine

## 2022-12-27 DIAGNOSIS — N390 Urinary tract infection, site not specified: Secondary | ICD-10-CM | POA: Insufficient documentation

## 2022-12-27 LAB — POCT URINALYSIS DIP (MANUAL ENTRY)
Bilirubin, UA: NEGATIVE
Glucose, UA: NEGATIVE mg/dL
Ketones, POC UA: NEGATIVE mg/dL
Nitrite, UA: NEGATIVE
Protein Ur, POC: 30 mg/dL — AB
Spec Grav, UA: 1.025 (ref 1.010–1.025)
Urobilinogen, UA: 1 U/dL
pH, UA: 7 (ref 5.0–8.0)

## 2022-12-27 MED ORDER — CEPHALEXIN 500 MG PO CAPS: 500.0000 mg | ORAL_CAPSULE | Freq: Two times a day (BID) | ORAL | 0 refills | Status: DC

## 2022-12-27 NOTE — ED Triage Notes (Signed)
Pt c/o UTI symptoms , urinary urgency and frequency, abdominal pressure x 5 days

## 2022-12-27 NOTE — ED Provider Notes (Signed)
[facility]    CSN: [id number] Arrival date & time: 12/27/22  1226      History   Chief Complaint No chief complaint on file.   HPI Heidi Dean is a 22 y.o. female.   Patient presenting today with 5-day history of urinary urgency, frequency, suprapubic pressure.  Denies hematuria, dysuria, vaginal discharge or itching, fever, nausea, vomiting, flank pain.  So far not trying anything over-the-counter for symptoms.  LMP 12/19/2022.    Past Medical History:  Diagnosis Date   No pertinent past medical history     Patient Active Problem List   Diagnosis Date Noted   Right upper quadrant pain 09/06/2018    Past Surgical History:  Procedure Laterality Date   NO PAST SURGERIES      OB History   No obstetric history on file.      Home Medications    Prior to Admission medications   Medication Sig Start Date End Date Taking? Authorizing Provider  cephALEXin (KEFLEX) 500 MG capsule Take 1 capsule (500 mg total) by mouth 2 (two) times daily. 12/27/22  Yes Particia Nearing, PA-C    Family History Family History  Problem Relation Age of Onset   Cancer Paternal Grandmother    Diabetes Maternal Grandmother    Hypertension Maternal Grandfather    Cancer Mother     Social History Social History   Tobacco Use   Smoking status: Never   Smokeless tobacco: Never  Vaping Use   Vaping status: Never Used  Substance Use Topics   Alcohol use: Yes    Comment: ocassionally   Drug use: Never     Allergies   Patient has no known allergies.   Review of Systems Review of Systems Per HPI  Physical Exam Triage Vital Signs ED Triage Vitals  Encounter Vitals Group     BP 12/27/22 1236 114/74     Systolic BP Percentile --      Diastolic BP Percentile --      Pulse Rate 12/27/22 1236 70     Resp 12/27/22 1236 14     Temp 12/27/22 1236 98.3 F (36.8 C)     Temp Source 12/27/22 1236 Oral     SpO2 12/27/22 1236 98 %     Weight --       Height --      Head Circumference --      Peak Flow --      Pain Score 12/27/22 1240 5     Pain Loc --      Pain Education --      Exclude from Growth Chart --    No data found.  Updated Vital Signs BP 114/74 (BP Location: Right Arm)   Pulse 70   Temp 98.3 F (36.8 C) (Oral)   Resp 14   LMP 12/19/2022 (Exact Date)   SpO2 98%   Visual Acuity Right Eye Distance:   Left Eye Distance:   Bilateral Distance:    Right Eye Near:   Left Eye Near:    Bilateral Near:     Physical Exam Vitals and nursing note reviewed.  Constitutional:      Appearance: Normal appearance. She is not ill-appearing.  HENT:     Head: Atraumatic.  Eyes:     Extraocular Movements: Extraocular movements intact.     Conjunctiva/sclera: Conjunctivae normal.  Cardiovascular:     Rate and Rhythm: Normal rate and regular rhythm.     Heart sounds: Normal heart sounds.  Pulmonary:     Effort: Pulmonary effort is normal.     Breath sounds: Normal breath sounds.  Abdominal:     General: Bowel sounds are normal. There is no distension.     Palpations: Abdomen is soft.     Tenderness: There is no abdominal tenderness. There is no right CVA tenderness, left CVA tenderness or guarding.  Musculoskeletal:        General: Normal range of motion.     Cervical back: Normal range of motion and neck supple.  Skin:    General: Skin is warm and dry.  Neurological:     Mental Status: She is alert and oriented to person, place, and time.  Psychiatric:        Mood and Affect: Mood normal.        Thought Content: Thought content normal.        Judgment: Judgment normal.      UC Treatments / Results  Labs (all labs ordered are listed, but only abnormal results are displayed) Labs Reviewed  POCT URINALYSIS DIP (MANUAL ENTRY) - Abnormal; Notable for the following components:      Result Value   Blood, UA small (*)    Protein Ur, POC =30 (*)    Leukocytes, UA Small (1+) (*)    All other components within normal  limits  URINE CULTURE    EKG   Radiology No results found.  Procedures Procedures (including critical care time)  Medications Ordered in UC Medications - No data to display  Initial Impression / Assessment and Plan / UC Course  I have reviewed the triage vital signs and the nursing notes.  Pertinent labs & imaging results that were available during my care of the patient were reviewed by me and considered in my medical decision making (see chart for details).     Vitals and exam reassuring today, urinalysis with evidence of a possible urinary tract infection.  Treat with Keflex while awaiting urine culture and adjust if needed.  Discussed fluids, full emptying of bladder and follow-up precautions.  Final Clinical Impressions(s) / UC Diagnoses   Final diagnoses:  Acute lower UTI     Discharge Instructions      We will let you know if your urine culture shows we need to make changes to your medication.    ED Prescriptions     Medication Sig Dispense Auth. Provider   cephALEXin (KEFLEX) 500 MG capsule Take 1 capsule (500 mg total) by mouth 2 (two) times daily. 10 capsule Particia Nearing, [place]      PDMP not reviewed this encounter.   Particia Nearing, [place] 12/27/22 [phone]

## 2022-12-27 NOTE — Discharge Instructions (Signed)
We will let you know if your urine culture shows we need to make changes to your medication.

## 2022-12-30 LAB — URINE CULTURE: Culture: 80000 — AB

## 2023-03-29 NOTE — L&D Delivery Note (Addendum)
 Labor Progress Heidi Dean is a 23 y.o. female G1P1001 with IUP at [redacted]w[redacted]d admitted for SROM.  She progressed without augmentation to complete and pushed > 2 hours to deliver.  Maternal temp of 100.9 with intermittent fetal tachycardia so treated for Triple I with IV abx during labor.  Cord clamping delayed by 1-3 minutes then clamped by CNM and cut by FOB.  Placenta intact and spontaneous, bleeding minimal.  Second degree perineal laceration repaired without difficulty.  Moderate amount of bleeding noted prior to laceration repair with <500 ml  measurable until bleeding increased after repair.  Pitocin   and TXA given prior to delivery of placenta, then methergine IM given after bleeding increased. Sweep of lower uterine segment with large clots removed.  Dr Izell to room. PPH Code called. Jada placed by CNM, balloon filled to 120 cc by RN, and attached to wall suction.  350 ml in canister and bleeding noted around Jada in vaginal vault.  Dr Izell removed Jada, inspected cervix/vaginal vault for lacerations and no additional lacerations found.  New Jada device inserted by Dr Izell, balloon filled, and wall suction applied.  Pt remained stable, and tolerated all procedures well.  Infant skin to skin with pt husband after first Jada placed.    Dr Izell to reassess Jada in 1 hour per protocol.   Delivery Note At 4:04 AM a viable and healthy female was delivered via Vaginal, Spontaneous (Presentation: Right Occiput Anterior).  APGAR: 5, 9; weight 9 lb 3.4 oz (4180 g).   Placenta status: Spontaneous, Intact.  Cord: 3 vessels with the following complications: None.    Anesthesia: Epidural Episiotomy: None Lacerations: 2nd degree;Perineal Suture Repair: 3.0 vicryl Est. Blood Loss (mL): 1961  Mom to postpartum.  Baby to Couplet care / Skin to Skin.  Olam Boards 03/27/2024, 5:35 AM

## 2023-06-11 ENCOUNTER — Encounter: Payer: Self-pay | Admitting: Emergency Medicine

## 2023-06-11 ENCOUNTER — Ambulatory Visit
Admission: EM | Admit: 2023-06-11 | Discharge: 2023-06-11 | Disposition: A | Discharge status: Home / Self Care | Attending: Nurse Practitioner | Admitting: Nurse Practitioner

## 2023-06-11 DIAGNOSIS — R1030 Lower abdominal pain, unspecified: Secondary | ICD-10-CM | POA: Diagnosis present

## 2023-06-11 DIAGNOSIS — R350 Frequency of micturition: Secondary | ICD-10-CM

## 2023-06-11 LAB — POCT URINALYSIS DIP (MANUAL ENTRY)
Bilirubin, UA: NEGATIVE
Glucose, UA: NEGATIVE mg/dL
Ketones, POC UA: NEGATIVE mg/dL
Leukocytes, UA: NEGATIVE
Nitrite, UA: NEGATIVE
Protein Ur, POC: NEGATIVE mg/dL
Spec Grav, UA: 1.025 (ref 1.010–1.025)
Urobilinogen, UA: 0.2 U/dL
pH, UA: 5.5 (ref 5.0–8.0)

## 2023-06-11 LAB — POCT URINE PREGNANCY: Preg Test, Ur: NEGATIVE

## 2023-06-11 NOTE — ED Triage Notes (Signed)
 Urinary frequency and lower ABD pain since Thursday.

## 2023-06-11 NOTE — ED Provider Notes (Signed)
 [facility]    CSN: [id number] Arrival date & time: 06/11/23  0948      History   Chief Complaint Chief Complaint  Patient presents with   Urinary Frequency    HPI Heidi Dean is a 23 y.o. female.   The history is provided by the patient.   Patient presents for complaints of urinary frequency and lower abdominal pain is been present for the past 3 days.  Patient denies fever, chills, nausea, vomiting, diarrhea, constipation, dysuria, hematuria, flank pain, low back pain, or vaginal symptoms.  Patient states her last UTI was last year.  LMP 05/28/2023.  Denies previous history of kidney stones or pyelonephritis.  Past Medical History:  Diagnosis Date   No pertinent past medical history     Patient Active Problem List   Diagnosis Date Noted   Right upper quadrant pain 09/06/2018    Past Surgical History:  Procedure Laterality Date   NO PAST SURGERIES      OB History   No obstetric history on file.      Home Medications    Prior to Admission medications   Not on File    Family History Family History  Problem Relation Age of Onset   Cancer Paternal Grandmother    Diabetes Maternal Grandmother    Hypertension Maternal Grandfather    Cancer Mother     Social History Social History   Tobacco Use   Smoking status: Never   Smokeless tobacco: Never  Vaping Use   Vaping status: Never Used  Substance Use Topics   Alcohol use: Yes    Comment: ocassionally   Drug use: Never     Allergies   Patient has no known allergies.   Review of Systems Review of Systems Per HPI  Physical Exam Triage Vital Signs ED Triage Vitals  Encounter Vitals Group     BP 06/11/23 1036 138/85     Systolic BP Percentile --      Diastolic BP Percentile --      Pulse Rate 06/11/23 1036 67     Resp 06/11/23 1036 18     Temp 06/11/23 1036 98.6 F (37 C)     Temp Source 06/11/23 1036 Oral     SpO2 06/11/23 1036 98 %     Weight --      Height --       Head Circumference --      Peak Flow --      Pain Score 06/11/23 1037 0     Pain Loc --      Pain Education --      Exclude from Growth Chart --    No data found.  Updated Vital Signs BP 138/85 (BP Location: Right Arm)   Pulse 67   Temp 98.6 F (37 C) (Oral)   Resp 18   LMP 05/28/2023 (Exact Date)   SpO2 98%   Visual Acuity Right Eye Distance:   Left Eye Distance:   Bilateral Distance:    Right Eye Near:   Left Eye Near:    Bilateral Near:     Physical Exam Vitals and nursing note reviewed.  Constitutional:      General: She is not in acute distress.    Appearance: Normal appearance.  HENT:     Head: Normocephalic.  Eyes:     Extraocular Movements: Extraocular movements intact.     Conjunctiva/sclera: Conjunctivae normal.     Pupils: Pupils are equal, round, and reactive to light.  Cardiovascular:     Rate and Rhythm: Normal rate and regular rhythm.     Pulses: Normal pulses.     Heart sounds: Normal heart sounds.  Pulmonary:     Effort: Pulmonary effort is normal. No respiratory distress.     Breath sounds: Normal breath sounds. No stridor. No wheezing, rhonchi or rales.  Abdominal:     General: Bowel sounds are normal.     Palpations: Abdomen is soft.     Tenderness: There is no abdominal tenderness. There is no right CVA tenderness or left CVA tenderness.  Musculoskeletal:     Cervical back: Normal range of motion.  Skin:    General: Skin is warm and dry.  Neurological:     General: No focal deficit present.     Mental Status: She is alert and oriented to person, place, and time.  Psychiatric:        Mood and Affect: Mood normal.        Behavior: Behavior normal.      UC Treatments / Results  Labs (all labs ordered are listed, but only abnormal results are displayed) Labs Reviewed  POCT URINALYSIS DIP (MANUAL ENTRY) - Abnormal; Notable for the following components:      Result Value   Blood, UA trace-intact (*)    All other components  within normal limits  POCT URINE PREGNANCY    EKG   Radiology No results found.  Procedures Procedures (including critical care time)  Medications Ordered in UC Medications - No data to display  Initial Impression / Assessment and Plan / UC Course  I have reviewed the triage vital signs and the nursing notes.  Pertinent labs & imaging results that were available during my care of the patient were reviewed by me and considered in my medical decision making (see chart for details).  Urinalysis is positive for blood.  No other abnormal findings.  Urine pregnancy is negative.  Urine culture is pending.  Supportive care recommendations were provided and discussed with the patient to include fluids, rest, and over-the-counter analgesics.  Discussed indications regarding follow-up.  Patient was advised if the culture result is negative, and she is continuing to experience symptoms, recommend follow-up with her PCP or with gynecology.  Patient was in agreement with this plan of care and verbalized understanding.  All questions were answered.  Patient stable for discharge.  Final Clinical Impressions(s) / UC Diagnoses   Final diagnoses:  Urinary frequency  Lower abdominal pain     Discharge Instructions      The urine pregnancy test was negative.  Urine culture is pending.  You will be contacted if the test result is abnormal. May take over-the-counter Tylenol or ibuprofen as needed for pain, fever, or general discomfort. Make sure you are drinking at least 8-10 8 ounce glasses of water daily. If symptoms worsen to include fever, chills, worsening abdominal pain, or other concerns, please go to the emergency department immediately for further evaluation. If your urine culture is negative, and you are continue to experience symptoms, please follow-up with your primary care physician or with gynecology for further evaluation. Follow-up as needed.     ED Prescriptions   None     PDMP not reviewed this encounter.   Abran Cantor, NP 06/11/23 1448

## 2023-06-11 NOTE — Discharge Instructions (Addendum)
 The urine pregnancy test was negative.  Urine culture is pending.  You will be contacted if the test result is abnormal. May take over-the-counter Tylenol or ibuprofen as needed for pain, fever, or general discomfort. Make sure you are drinking at least 8-10 8 ounce glasses of water daily. If symptoms worsen to include fever, chills, worsening abdominal pain, or other concerns, please go to the emergency department immediately for further evaluation. If your urine culture is negative, and you are continue to experience symptoms, please follow-up with your primary care physician or with gynecology for further evaluation. Follow-up as needed.

## 2023-06-12 ENCOUNTER — Ambulatory Visit
Admission: EM | Admit: 2023-06-12 | Discharge: 2023-06-12 | Disposition: A | Discharge status: Home / Self Care | Attending: Family Medicine | Admitting: Family Medicine

## 2023-06-12 DIAGNOSIS — R3915 Urgency of urination: Secondary | ICD-10-CM | POA: Diagnosis not present

## 2023-06-12 DIAGNOSIS — R350 Frequency of micturition: Secondary | ICD-10-CM | POA: Insufficient documentation

## 2023-06-12 LAB — URINE CULTURE: Culture: NO GROWTH

## 2023-06-12 LAB — POCT URINALYSIS DIP (MANUAL ENTRY)
Bilirubin, UA: NEGATIVE
Glucose, UA: NEGATIVE mg/dL
Ketones, POC UA: NEGATIVE mg/dL
Leukocytes, UA: NEGATIVE
Nitrite, UA: NEGATIVE
Protein Ur, POC: NEGATIVE mg/dL
Spec Grav, UA: 1.01 (ref 1.010–1.025)
Urobilinogen, UA: 0.2 U/dL
pH, UA: 6 (ref 5.0–8.0)

## 2023-06-12 MED ORDER — PHENAZOPYRIDINE HCL 100 MG PO TABS: 100.0000 mg | ORAL_TABLET | Freq: Three times a day (TID) | ORAL | 0 refills | Status: DC | PRN

## 2023-06-12 NOTE — ED Provider Notes (Signed)
 [facility]    CSN: [id number] Arrival date & time: 06/12/23  1650      History   Chief Complaint Chief Complaint  Patient presents with   Urinary Frequency    HPI Heidi Dean is a 23 y.o. female.   Patient presenting today with 4-day history of urinary frequency, urgency.  Denies significant dysuria, vaginal discharge, rashes, lesions, abdominal pain, fevers, chills, nausea, vomiting, flank pain.  Was seen yesterday at this urgent care, urinalysis was negative apart from slight blood in urine culture came back negative.  Denies any new products, dietary changes, medication changes recently.  No new sexual partners.  LMP 05/28/23.    Past Medical History:  Diagnosis Date   No pertinent past medical history     Patient Active Problem List   Diagnosis Date Noted   Right upper quadrant pain 09/06/2018    Past Surgical History:  Procedure Laterality Date   NO PAST SURGERIES      OB History   No obstetric history on file.      Home Medications    Prior to Admission medications   Medication Sig Start Date End Date Taking? Authorizing Provider  phenazopyridine (PYRIDIUM) 100 MG tablet Take 1 tablet (100 mg total) by mouth 3 (three) times daily as needed for pain. 06/12/23  Yes Particia Nearing, PA-C    Family History Family History  Problem Relation Age of Onset   Cancer Paternal Grandmother    Diabetes Maternal Grandmother    Hypertension Maternal Grandfather    Cancer Mother     Social History Social History   Tobacco Use   Smoking status: Never   Smokeless tobacco: Never  Vaping Use   Vaping status: Never Used  Substance Use Topics   Alcohol use: Yes    Comment: ocassionally   Drug use: Never     Allergies   Patient has no known allergies.   Review of Systems Review of Systems per HPI  Physical Exam Triage Vital Signs ED Triage Vitals [06/12/23 1732]  Encounter Vitals Group     BP 122/82     Systolic BP  Percentile      Diastolic BP Percentile      Pulse Rate 77     Resp 16     Temp 98.3 F (36.8 C)     Temp Source Oral     SpO2 99 %     Weight      Height      Head Circumference      Peak Flow      Pain Score 0     Pain Loc      Pain Education      Exclude from Growth Chart    No data found.  Updated Vital Signs BP 122/82 (BP Location: Right Arm)   Pulse 77   Temp 98.3 F (36.8 C) (Oral)   Resp 16   LMP 05/28/2023 (Exact Date)   SpO2 99%   Visual Acuity Right Eye Distance:   Left Eye Distance:   Bilateral Distance:    Right Eye Near:   Left Eye Near:    Bilateral Near:     Physical Exam Vitals and nursing note reviewed.  Constitutional:      Appearance: Normal appearance. She is not ill-appearing.  HENT:     Head: Atraumatic.  Eyes:     Extraocular Movements: Extraocular movements intact.     Conjunctiva/sclera: Conjunctivae normal.  Cardiovascular:     Rate  and Rhythm: Normal rate.  Pulmonary:     Effort: Pulmonary effort is normal.  Abdominal:     General: Bowel sounds are normal. There is no distension.     Palpations: Abdomen is soft.     Tenderness: There is no abdominal tenderness. There is no right CVA tenderness, left CVA tenderness or guarding.  Genitourinary:    Comments: GU exam deferred, self swab performed Musculoskeletal:        General: Normal range of motion.     Cervical back: Normal range of motion and neck supple.  Skin:    General: Skin is warm and dry.  Neurological:     Mental Status: She is alert and oriented to person, place, and time.  Psychiatric:        Mood and Affect: Mood normal.        Thought Content: Thought content normal.        Judgment: Judgment normal.      UC Treatments / Results  Labs (all labs ordered are listed, but only abnormal results are displayed) Labs Reviewed  POCT URINALYSIS DIP (MANUAL ENTRY) - Abnormal; Notable for the following components:      Result Value   Blood, UA trace-intact (*)     All other components within normal limits  CERVICOVAGINAL ANCILLARY ONLY    EKG   Radiology No results found.  Procedures Procedures (including critical care time)  Medications Ordered in UC Medications - No data to display  Initial Impression / Assessment and Plan / UC Course  I have reviewed the triage vital signs and the nursing notes.  Pertinent labs & imaging results that were available during my care of the patient were reviewed by me and considered in my medical decision making (see chart for details).     Vitals and exam reassuring today, urinalysis again with trace blood but otherwise no abnormalities noted.  Vaginal swab pending for further evaluation, trial Pyridium to see if this improves symptoms and push fluids.  Has OB/GYN follow-up tomorrow. Final Clinical Impressions(s) / UC Diagnoses   Final diagnoses:  Urinary urgency  Urinary frequency     Discharge Instructions      We have sent out a vaginal swab for further evaluation but your urine again does not show a urinary tract infection.  I have sent in a medication to help with burning and urgency and drink plenty of water.  Follow-up with OB/GYN tomorrow.    ED Prescriptions     Medication Sig Dispense Auth. Provider   phenazopyridine (PYRIDIUM) 100 MG tablet Take 1 tablet (100 mg total) by mouth 3 (three) times daily as needed for pain. 10 tablet Particia Nearing, [place]      PDMP not reviewed this encounter.   Particia Nearing, [place] 06/12/23 1821

## 2023-06-12 NOTE — Discharge Instructions (Signed)
 We have sent out a vaginal swab for further evaluation but your urine again does not show a urinary tract infection.  I have sent in a medication to help with burning and urgency and drink plenty of water.  Follow-up with OB/GYN tomorrow.

## 2023-06-12 NOTE — ED Triage Notes (Signed)
 Pt states she was seen on 3/16 for urinary frequency but culture came back negative. Pt states she is still having urinary frequency that has not gone away x 4 days.

## 2023-06-13 ENCOUNTER — Encounter: Payer: Self-pay | Admitting: Adult Health

## 2023-06-13 ENCOUNTER — Ambulatory Visit (INDEPENDENT_AMBULATORY_CARE_PROVIDER_SITE_OTHER): Admitting: Adult Health

## 2023-06-13 VITALS — BP 113/73 | HR 72 | Ht 62.0 in | Wt 174.0 lb

## 2023-06-13 DIAGNOSIS — R350 Frequency of micturition: Secondary | ICD-10-CM | POA: Diagnosis not present

## 2023-06-13 DIAGNOSIS — Z319 Encounter for procreative management, unspecified: Secondary | ICD-10-CM | POA: Insufficient documentation

## 2023-06-13 LAB — POCT URINALYSIS DIPSTICK
Glucose, UA: NEGATIVE
Ketones, UA: NEGATIVE
Leukocytes, UA: NEGATIVE
Nitrite, UA: NEGATIVE
Protein, UA: NEGATIVE

## 2023-06-13 LAB — CERVICOVAGINAL ANCILLARY ONLY
Bacterial Vaginitis (gardnerella): POSITIVE — AB
Candida Glabrata: NEGATIVE
Candida Vaginitis: NEGATIVE
Chlamydia: NEGATIVE
Comment: NEGATIVE
Comment: NEGATIVE
Comment: NEGATIVE
Comment: NEGATIVE
Comment: NEGATIVE
Comment: NORMAL
Neisseria Gonorrhea: NEGATIVE
Trichomonas: NEGATIVE

## 2023-06-13 MED ORDER — PRENATAL PLUS 27-1 MG PO TABS: 1.0000 | ORAL_TABLET | Freq: Every day | ORAL | 12 refills | Status: AC

## 2023-06-13 NOTE — Progress Notes (Signed)
  Subjective:     Patient ID: Heidi Dean, female   DOB: 28-Apr-2000, 23 y.o.   MRN: [id number]  HPI Heidi Dean is a 23 year old Hispanic female,married, G0P0, in complaining of urinary frequency for 6 days,  was seen at Urgent Care yesterday. And she wants to get pregnant. Has interpreter with her.       Component Value Date/Time   DIAGPAP - Low grade squamous intraepithelial lesion (LSIL) (A) 11/23/2022 0908   ADEQPAP  11/23/2022 0908    Satisfactory for evaluation; transformation zone component PRESENT.     Review of Systems Urinary frequency x 6 days Periods regular    Reviewed past medical,surgical, social and family history. Reviewed medications and allergies.  Objective:   Physical Exam BP 113/73 (BP Location: Right Arm, Patient Position: Sitting, Cuff Size: Normal)   Pulse 72   Ht 5\' 2"  (1.575 m)   Wt 174 lb (78.9 kg)   LMP 05/28/2023 (Exact Date)   BMI 31.83 kg/m  urine dipstick trace blood. Skin warm and dry.  Lungs: clear to ausculation bilaterally. Cardiovascular: regular rate and rhythm. No CVAT.  Exam by Marylynn Pearson NP student under my supervision.   [facility] - 06/13/23 1358       Pregnancy Intention Screening   Does the patient want to become pregnant in the next year? Yes    Does the patient's partner want to become pregnant in the next year? Yes    Would the patient like to discuss contraceptive options today? No      Contraception Wrap Up   Current Method Pregnant/Seeking Pregnancy    End Method Pregnant/Seeking Pregnancy    Contraception Counseling Provided No                Assessment:     1. Urinary frequency (Primary) Urinary frequency for 6 days Push water, limit caffeine Discussed just trace blood, will not give antibiotic at this time  - POCT Urinalysis Dipstick  2. Patient desires pregnancy  Discussed timing of sex and can take 6-18 months of active trying Will rx PNV Meds ordered this encounter  Medications   prenatal  vitamin w/FE, FA (PRENATAL 1 + 1) 27-1 MG TABS tablet    Sig: Take 1 tablet by mouth daily at 12 noon.    Dispense:  30 tablet    Refill:  12    Supervising Provider:   Lazaro Arms [[id number]]   She declines progesterone level at this time     Plan:     Return 11/24/23 for pap and physical

## 2023-06-14 ENCOUNTER — Telehealth (HOSPITAL_COMMUNITY): Payer: Self-pay

## 2023-06-14 MED ORDER — METRONIDAZOLE 500 MG PO TABS: 500.0000 mg | ORAL_TABLET | Freq: Two times a day (BID) | ORAL | 0 refills | Status: AC

## 2023-06-14 NOTE — Telephone Encounter (Signed)
 Per protocol, pt requires tx with metronidazole. Rx sent to pharmacy on file.

## 2023-07-26 ENCOUNTER — Encounter: Payer: Self-pay | Admitting: Emergency Medicine

## 2023-07-26 ENCOUNTER — Other Ambulatory Visit: Payer: Self-pay

## 2023-07-26 ENCOUNTER — Ambulatory Visit: Admission: EM | Admit: 2023-07-26 | Discharge: 2023-07-26 | Disposition: A | Discharge status: Home / Self Care

## 2023-07-26 DIAGNOSIS — R059 Cough, unspecified: Secondary | ICD-10-CM | POA: Diagnosis not present

## 2023-07-26 DIAGNOSIS — J069 Acute upper respiratory infection, unspecified: Secondary | ICD-10-CM | POA: Diagnosis present

## 2023-07-26 DIAGNOSIS — J029 Acute pharyngitis, unspecified: Secondary | ICD-10-CM | POA: Diagnosis present

## 2023-07-26 DIAGNOSIS — B9789 Other viral agents as the cause of diseases classified elsewhere: Secondary | ICD-10-CM | POA: Diagnosis not present

## 2023-07-26 LAB — POCT RAPID STREP A (OFFICE): Rapid Strep A Screen: NEGATIVE

## 2023-07-26 LAB — POC COVID19/FLU A&B COMBO
Covid Antigen, POC: NEGATIVE
Influenza A Antigen, POC: NEGATIVE
Influenza B Antigen, POC: NEGATIVE

## 2023-07-26 MED ORDER — FLUTICASONE PROPIONATE 50 MCG/ACT NA SUSP: 2.0000 | Freq: Every day | NASAL | 0 refills | Status: AC

## 2023-07-26 NOTE — ED Provider Notes (Addendum)
 [facility]    CSN: [id number] Arrival date & time: 07/26/23  1329      History   Chief Complaint Chief Complaint  Patient presents with   Sore Throat    HPI Heidi Dean is a 23 y.o. female.   The history is provided by the patient and the spouse.   Patient brought in by her spouse with complaints of sore throat, runny nose, and cough.  Patient states symptoms started over the past 24 hours.  Patient also endorses subjective fever.  Denies headache, ear pain, wheezing, difficulty breathing, chest pain, abdominal pain, nausea, vomiting, diarrhea, or rash.  Patient has been taking Tylenol for her symptoms.  Reports that her father has recently had the same or similar symptoms. Past Medical History:  Diagnosis Date   No pertinent past medical history     Patient Active Problem List   Diagnosis Date Noted   Patient desires pregnancy 06/13/2023   Urinary frequency 06/13/2023   Right upper quadrant pain 09/06/2018    Past Surgical History:  Procedure Laterality Date   NO PAST SURGERIES      OB History     Gravida  1   Para  0   Term  0   Preterm  0   AB  0   Living  0      SAB  0   IAB  0   Ectopic  0   Multiple  0   Live Births  0            Home Medications    Prior to Admission medications   Medication Sig Start Date End Date Taking? Authorizing Provider  fluticasone (FLONASE) 50 MCG/ACT nasal spray Place 2 sprays into both nostrils daily. 07/26/23  Yes Leath-Warren, Belen Bowers, NP  Prenatal Vit-Fe Fumarate-FA (WESTAB PLUS) 27-1 MG TABS Take by mouth.   Yes [provider]  phenazopyridine  (PYRIDIUM ) 100 MG tablet Take 1 tablet (100 mg total) by mouth 3 (three) times daily as needed for pain. 06/12/23   Corbin Dess, PA-C  prenatal vitamin w/FE, FA (PRENATAL 1 + 1) 27-1 MG TABS tablet Take 1 tablet by mouth daily at 12 noon. 06/13/23   Javan Messing, NP    Family History Family History   Problem Relation Age of Onset   Cancer Paternal Grandmother    Diabetes Maternal Grandmother    Hypertension Maternal Grandfather    Cancer Mother     Social History Social History   Tobacco Use   Smoking status: Never   Smokeless tobacco: Never  Vaping Use   Vaping status: Never Used  Substance Use Topics   Alcohol use: Yes    Comment: ocassionally   Drug use: Never     Allergies   Patient has no known allergies.   Review of Systems Review of Systems Per HPI  Physical Exam Triage Vital Signs ED Triage Vitals  Encounter Vitals Group     BP 07/26/23 1339 128/81     Systolic BP Percentile --      Diastolic BP Percentile --      Pulse Rate 07/26/23 1339 (!) 102     Resp 07/26/23 1339 18     Temp 07/26/23 1339 98.6 F (37 C)     Temp Source 07/26/23 1339 Oral     SpO2 07/26/23 1339 97 %     Weight --      Height --      Head Circumference --  Peak Flow --      Pain Score 07/26/23 1336 6     Pain Loc --      Pain Education --      Exclude from Growth Chart --    No data found.  Updated Vital Signs BP 128/81 (BP Location: Right Arm)   Pulse (!) 102   Temp 98.6 F (37 C) (Oral)   Resp 18   LMP 05/28/2023 (Approximate)   SpO2 97%   Visual Acuity Right Eye Distance:   Left Eye Distance:   Bilateral Distance:    Right Eye Near:   Left Eye Near:    Bilateral Near:     Physical Exam Vitals and nursing note reviewed.  Constitutional:      General: She is not in acute distress.    Appearance: She is well-developed.  HENT:     Head: Normocephalic.     Right Ear: Tympanic membrane, ear canal and external ear normal.     Left Ear: Tympanic membrane, ear canal and external ear normal.     Nose: Congestion present.     Right Turbinates: Enlarged and swollen.     Left Turbinates: Enlarged and swollen.     Right Sinus: No maxillary sinus tenderness or frontal sinus tenderness.     Left Sinus: No maxillary sinus tenderness or frontal sinus  tenderness.     Mouth/Throat:     Lips: Pink.     Mouth: Mucous membranes are moist.     Pharynx: Uvula midline. Pharyngeal swelling, posterior oropharyngeal erythema and postnasal drip present. No oropharyngeal exudate or uvula swelling.     Tonsils: 1+ on the right. 1+ on the left.     Comments: Cobblestoning present to posterior oropharynx  Eyes:     Extraocular Movements: Extraocular movements intact.     Conjunctiva/sclera: Conjunctivae normal.     Pupils: Pupils are equal, round, and reactive to light.  Cardiovascular:     Rate and Rhythm: Regular rhythm. Tachycardia present.     Pulses: Normal pulses.     Heart sounds: Normal heart sounds.  Pulmonary:     Effort: Pulmonary effort is normal. No respiratory distress.     Breath sounds: Normal breath sounds. No stridor. No wheezing, rhonchi or rales.  Abdominal:     General: Bowel sounds are normal.     Palpations: Abdomen is soft.     Tenderness: There is no abdominal tenderness.  Musculoskeletal:     Cervical back: Normal range of motion.  Lymphadenopathy:     Cervical: No cervical adenopathy.  Skin:    General: Skin is warm and dry.  Neurological:     General: No focal deficit present.     Mental Status: She is alert and oriented to person, place, and time.  Psychiatric:        Mood and Affect: Mood normal.        Behavior: Behavior normal.      UC Treatments / Results  Labs (all labs ordered are listed, but only abnormal results are displayed) Labs Reviewed  POCT RAPID STREP A (OFFICE) - Normal  CULTURE, GROUP A STREP ([facility])  POC COVID19/FLU A&B COMBO    EKG   Radiology No results found.  Procedures Procedures (including critical care time)  Medications Ordered in UC Medications - No data to display  Initial Impression / Assessment and Plan / UC Course  I have reviewed the triage vital signs and the nursing notes.  Pertinent labs & imaging  results that were available during my care of the patient  were reviewed by me and considered in my medical decision making (see chart for details).  The rapid strep test and COVID/flu test were negative.  Throat culture is pending.  Symptoms consistent with a viral respiratory infection with cough.  Will provide symptomatic treatment with fluticasone 50 mcg nasal spray for nasal congestion and runny nose.  Patient was given information regarding medications that she can take over-the-counter as she is pregnant.  Supportive care recommendations were provided and discussed with the patient to include fluids, rest, continuing over-the-counter Tylenol, warm salt water gargles, and normal saline nasal spray for nasal congestion and runny nose.  Discussed indications with patient regarding follow-up.  Patient and spouse were in agreement with this plan of care and verbalized understanding.  All questions were answered.  Patient stable for discharge.  Final Clinical Impressions(s) / UC Diagnoses   Final diagnoses:  Viral URI with cough  Sore throat     Discharge Instructions      The rapid strep test and COVID/flu test were negative.  A throat culture is pending.  You will be contacted if the pending test result is abnormal.  You will also have access to your results via MyChart. Take medication as prescribed. Continue over-the-counter Tylenol as needed for pain, fever, or general discomfort. You may use normal saline nasal spray throughout the day for nasal congestion and runny nose. For your cough, recommend use of a humidifier in your bedroom at nighttime during sleep and sleeping elevated on pillows while symptoms persist. Warm salt water gargles 3-4 times daily as needed for throat pain or discomfort.  You can also use over-the-counter Chloraseptic throat spray for throat pain or discomfort. You may take over-the-counter Delsym, Robitussin, or Mucinex (plain, no DM) for your cough.  Recommend checking with the pharmacy to ensure that you have selected is  safe for pregnancy. Symptoms should improve over the next 7 to 10 days.  If symptoms fail to improve, or appear to be worsening, you may follow-up in this clinic or with your primary care physician for further evaluation. Follow-up as needed.     ED Prescriptions     Medication Sig Dispense Auth. Provider   fluticasone (FLONASE) 50 MCG/ACT nasal spray Place 2 sprays into both nostrils daily. 16 g Leath-Warren, Belen Bowers, NP      PDMP not reviewed this encounter.   Hardy Lia, NP 07/26/23 1421    Leath-Warren, Belen Bowers, NP 07/26/23 1430

## 2023-07-26 NOTE — ED Triage Notes (Addendum)
 Pt reports runny nose,sore throat, cough for last several days. Intermittent chills and possible fever.   Pt is pregnant. LMP 05/28/23.

## 2023-07-26 NOTE — Discharge Instructions (Addendum)
 The rapid strep test and COVID/flu test were negative.  A throat culture is pending.  You will be contacted if the pending test result is abnormal.  You will also have access to your results via MyChart. Take medication as prescribed. Continue over-the-counter Tylenol as needed for pain, fever, or general discomfort. You may use normal saline nasal spray throughout the day for nasal congestion and runny nose. For your cough, recommend use of a humidifier in your bedroom at nighttime during sleep and sleeping elevated on pillows while symptoms persist. Warm salt water gargles 3-4 times daily as needed for throat pain or discomfort.  You can also use over-the-counter Chloraseptic throat spray for throat pain or discomfort. You may take over-the-counter Delsym, Robitussin, or Mucinex (plain, no DM) for your cough.  Recommend checking with the pharmacy to ensure that you have selected is safe for pregnancy. Symptoms should improve over the next 7 to 10 days.  If symptoms fail to improve, or appear to be worsening, you may follow-up in this clinic or with your primary care physician for further evaluation. Follow-up as needed.

## 2023-07-30 LAB — CULTURE, GROUP A STREP (THRC)

## 2023-07-31 ENCOUNTER — Encounter: Payer: Self-pay | Admitting: Obstetrics & Gynecology

## 2023-07-31 ENCOUNTER — Ambulatory Visit (INDEPENDENT_AMBULATORY_CARE_PROVIDER_SITE_OTHER)

## 2023-07-31 DIAGNOSIS — Z3201 Encounter for pregnancy test, result positive: Secondary | ICD-10-CM

## 2023-07-31 DIAGNOSIS — N926 Irregular menstruation, unspecified: Secondary | ICD-10-CM

## 2023-07-31 LAB — POCT URINE PREGNANCY: Preg Test, Ur: POSITIVE — AB

## 2023-07-31 NOTE — Progress Notes (Signed)
   NURSE VISIT- PREGNANCY CONFIRMATION   SUBJECTIVE:  Heidi Dean is a 23 y.o. G1P0000 female at Unknown by certain LMP of Patient's last menstrual period was 05/28/2023 (approximate). Here for pregnancy confirmation.  Home pregnancy test: positive x 4   She reports cramping nausea only in the morning but not too bad.  She is taking prenatal vitamins.    OBJECTIVE:  LMP 05/28/2023 (Approximate)   Appears well, in no apparent distress  Results for orders placed or performed in visit on 07/31/23 (from the past 24 hours)  POCT urine pregnancy   Collection Time: 07/31/23  3:50 PM  Result Value Ref Range   Preg Test, Ur Positive (A) Negative    ASSESSMENT: Positive pregnancy test, Unknown by LMP    PLAN: Schedule for dating ultrasound in 1-2 weeks Prenatal vitamins: continue   Nausea medicines: not currently needed   OB packet given: Yes  Warden Ha  07/31/2023 4:01 PM

## 2023-08-01 ENCOUNTER — Other Ambulatory Visit: Payer: Self-pay | Admitting: Obstetrics & Gynecology

## 2023-08-01 DIAGNOSIS — O3680X0 Pregnancy with inconclusive fetal viability, not applicable or unspecified: Secondary | ICD-10-CM

## 2023-08-02 ENCOUNTER — Ambulatory Visit (INDEPENDENT_AMBULATORY_CARE_PROVIDER_SITE_OTHER)

## 2023-08-02 ENCOUNTER — Encounter: Payer: Self-pay | Admitting: Obstetrics & Gynecology

## 2023-08-02 DIAGNOSIS — Z3A01 Less than 8 weeks gestation of pregnancy: Secondary | ICD-10-CM | POA: Diagnosis not present

## 2023-08-02 DIAGNOSIS — Z3491 Encounter for supervision of normal pregnancy, unspecified, first trimester: Secondary | ICD-10-CM | POA: Diagnosis not present

## 2023-08-02 DIAGNOSIS — O3680X0 Pregnancy with inconclusive fetal viability, not applicable or unspecified: Secondary | ICD-10-CM

## 2023-08-06 ENCOUNTER — Encounter: Payer: Self-pay | Admitting: Emergency Medicine

## 2023-08-06 ENCOUNTER — Ambulatory Visit
Admission: EM | Admit: 2023-08-06 | Discharge: 2023-08-06 | Disposition: A | Discharge status: Home / Self Care | Attending: Nurse Practitioner | Admitting: Nurse Practitioner

## 2023-08-06 DIAGNOSIS — J22 Unspecified acute lower respiratory infection: Secondary | ICD-10-CM

## 2023-08-06 DIAGNOSIS — R059 Cough, unspecified: Secondary | ICD-10-CM

## 2023-08-06 MED ORDER — ALBUTEROL SULFATE HFA 108 (90 BASE) MCG/ACT IN AERS: 2.0000 | INHALATION_SPRAY | Freq: Four times a day (QID) | RESPIRATORY_TRACT | 0 refills | Status: AC | PRN

## 2023-08-06 MED ORDER — AMOXICILLIN 500 MG PO CAPS: 500.0000 mg | ORAL_CAPSULE | Freq: Two times a day (BID) | ORAL | 0 refills | Status: AC

## 2023-08-06 NOTE — Discharge Instructions (Addendum)
 Take medication as prescribed.  Continue the cough and cold medications you are currently taking. Increase fluids and allow for plenty of rest. May take over-the-counter Tylenol as needed for pain, fever, or general discomfort. Recommend using a humidifier in your bedroom at nighttime during sleep and sleeping elevated on pillows while symptoms persist. As discussed, your cough may continue to persist from days to weeks.  If you continue to have a persistent nagging cough, continue over-the-counter cough and cold medications, increase your fluids, and use over-the-counter cough drops.  If you develop new symptoms such as fever, chills, wheezing, shortness of breath, difficulty breathing, seek care. Follow-up as needed.

## 2023-08-06 NOTE — ED Triage Notes (Signed)
 Continues to have a cough since 4/30.  Has been taking delsym and Flonase  for symptoms.  Patient is pregnant.

## 2023-08-06 NOTE — ED Provider Notes (Signed)
 [facility]    CSN: [id number] Arrival date & time: 08/06/23  0843      History   Chief Complaint No chief complaint on file.   HPI Heidi Dean is a 23 y.o. female.   The history is provided by the patient and the spouse.   Patient presents with spouse for continued cough.  Patient was diagnosed in this clinic on 07/26/2023 with a viral URI.  Patient states she has been taking over-the-counter cough medications, but cough continues to persist.  Patient denies fever, chills, headache, ear pain, nasal congestion, runny nose, wheezing, difficulty breathing, chest pain, abdominal pain, nausea, vomiting, diarrhea, or rash.  Patient states she does have periods of when she has difficulty catching her breath.  Patient is approximately [redacted] weeks pregnant.    Past Medical History:  Diagnosis Date   No pertinent past medical history     Patient Active Problem List   Diagnosis Date Noted   Patient desires pregnancy 06/13/2023   Urinary frequency 06/13/2023   Right upper quadrant pain 09/06/2018    Past Surgical History:  Procedure Laterality Date   NO PAST SURGERIES      OB History     Gravida  1   Para  0   Term  0   Preterm  0   AB  0   Living  0      SAB  0   IAB  0   Ectopic  0   Multiple  0   Live Births  0            Home Medications    Prior to Admission medications   Medication Sig Start Date End Date Taking? Authorizing Provider  fluticasone  (FLONASE ) 50 MCG/ACT nasal spray Place 2 sprays into both nostrils daily. 07/26/23   Leath-Warren, Belen Bowers, NP  prenatal vitamin w/FE, FA (PRENATAL 1 + 1) 27-1 MG TABS tablet Take 1 tablet by mouth daily at 12 noon. 06/13/23   Javan Messing, NP    Family History Family History  Problem Relation Age of Onset   Cancer Paternal Grandmother    Diabetes Maternal Grandmother    Hypertension Maternal Grandfather    Cancer Mother     Social History Social History    Tobacco Use   Smoking status: Never   Smokeless tobacco: Never  Vaping Use   Vaping status: Never Used  Substance Use Topics   Alcohol use: Yes    Comment: ocassionally   Drug use: Never     Allergies   Patient has no known allergies.   Review of Systems Review of Systems Per HPI  Physical Exam Triage Vital Signs ED Triage Vitals  Encounter Vitals Group     BP 08/06/23 0848 114/67     Systolic BP Percentile --      Diastolic BP Percentile --      Pulse Rate 08/06/23 0848 81     Resp 08/06/23 0848 18     Temp 08/06/23 0848 98.1 F (36.7 C)     Temp Source 08/06/23 0848 Oral     SpO2 08/06/23 0848 98 %     Weight --      Height --      Head Circumference --      Peak Flow --      Pain Score 08/06/23 0849 0     Pain Loc --      Pain Education --      Exclude from  Growth Chart --    No data found.  Updated Vital Signs BP 114/67 (BP Location: Right Arm)   Pulse 81   Temp 98.1 F (36.7 C) (Oral)   Resp 18   LMP 05/28/2023 (Approximate)   SpO2 98%   Visual Acuity Right Eye Distance:   Left Eye Distance:   Bilateral Distance:    Right Eye Near:   Left Eye Near:    Bilateral Near:     Physical Exam Vitals and nursing note reviewed.  Constitutional:      General: She is not in acute distress.    Appearance: Normal appearance.  HENT:     Head: Normocephalic.     Right Ear: Tympanic membrane, ear canal and external ear normal.     Left Ear: Tympanic membrane, ear canal and external ear normal.     Nose: Nose normal.     Right Turbinates: Enlarged and swollen.     Left Turbinates: Enlarged and swollen.     Right Sinus: No maxillary sinus tenderness or frontal sinus tenderness.     Left Sinus: No maxillary sinus tenderness or frontal sinus tenderness.     Mouth/Throat:     Lips: Pink.     Mouth: Mucous membranes are moist.     Pharynx: Postnasal drip present. No pharyngeal swelling, oropharyngeal exudate, posterior oropharyngeal erythema or uvula  swelling.     Comments: Cobblestoning present to posterior oropharynx  Eyes:     Extraocular Movements: Extraocular movements intact.     Pupils: Pupils are equal, round, and reactive to light.  Cardiovascular:     Rate and Rhythm: Normal rate and regular rhythm.     Pulses: Normal pulses.     Heart sounds: Normal heart sounds.  Pulmonary:     Effort: Pulmonary effort is normal.     Breath sounds: Normal breath sounds.  Abdominal:     General: Bowel sounds are normal.     Palpations: Abdomen is soft.     Tenderness: There is no abdominal tenderness.  Musculoskeletal:     Cervical back: Normal range of motion.  Skin:    General: Skin is warm and dry.  Neurological:     General: No focal deficit present.     Mental Status: She is alert and oriented to person, place, and time.  Psychiatric:        Mood and Affect: Mood normal.        Behavior: Behavior normal.      UC Treatments / Results  Labs (all labs ordered are listed, but only abnormal results are displayed) Labs Reviewed - No data to display  EKG   Radiology No results found.  Procedures Procedures (including critical care time)  Medications Ordered in UC Medications - No data to display  Initial Impression / Assessment and Plan / UC Course  I have reviewed the triage vital signs and the nursing notes.  Pertinent labs & imaging results that were available during my care of the patient were reviewed by me and considered in my medical decision making (see chart for details).  Patient with cough this been present for the past 2 weeks.  On exam, lung sounds are clear throughout, room air sats are at 98%.  Will treat empirically for lower respiratory infection with amoxicillin 500 mg twice daily for the next 7 days.  Will have patient continue over-the-counter Delsym or Robitussin for cough.  Patient also prescribed an albuterol  inhaler for shortness of breath or wheezing.  Discussion with patient regarding current  symptoms and duration that symptoms may continue to persist.  Supportive care recommendations were provided and discussed with the patient to include fluids, rest, use of a humidifier during sleep, and sleeping elevated on pillows while symptoms persist.  Patient was in agreement with this plan of care and verbalizes understanding.  All questions were answered.  Patient stable for discharge.   Final Clinical Impressions(s) / UC Diagnoses   Final diagnoses:  None   Discharge Instructions   None    ED Prescriptions   None    PDMP not reviewed this encounter.   Hardy Lia, NP 08/06/23 [phone]

## 2023-09-11 ENCOUNTER — Other Ambulatory Visit: Payer: Self-pay | Admitting: Obstetrics & Gynecology

## 2023-09-11 DIAGNOSIS — Z3682 Encounter for antenatal screening for nuchal translucency: Secondary | ICD-10-CM

## 2023-09-12 ENCOUNTER — Encounter: Payer: Self-pay | Admitting: Radiology

## 2023-09-12 NOTE — Progress Notes (Signed)
 US  6+1 wks,single IUP with yolk sac,CRL 4.8 mm,normal ovaries,FHR 121 bpm

## 2023-09-13 ENCOUNTER — Ambulatory Visit

## 2023-09-13 ENCOUNTER — Encounter: Admitting: *Deleted

## 2023-09-13 ENCOUNTER — Encounter: Payer: Self-pay | Admitting: Women's Health

## 2023-09-13 ENCOUNTER — Encounter: Payer: Self-pay | Admitting: *Deleted

## 2023-09-13 ENCOUNTER — Ambulatory Visit: Admitting: Women's Health

## 2023-09-13 VITALS — BP 107/61 | HR 86 | Wt 180.0 lb

## 2023-09-13 DIAGNOSIS — Z3400 Encounter for supervision of normal first pregnancy, unspecified trimester: Secondary | ICD-10-CM

## 2023-09-13 DIAGNOSIS — Z3682 Encounter for antenatal screening for nuchal translucency: Secondary | ICD-10-CM

## 2023-09-13 DIAGNOSIS — Z3A12 12 weeks gestation of pregnancy: Secondary | ICD-10-CM

## 2023-09-13 DIAGNOSIS — Z131 Encounter for screening for diabetes mellitus: Secondary | ICD-10-CM

## 2023-09-13 DIAGNOSIS — Z3402 Encounter for supervision of normal first pregnancy, second trimester: Secondary | ICD-10-CM

## 2023-09-13 DIAGNOSIS — Z1332 Encounter for screening for maternal depression: Secondary | ICD-10-CM

## 2023-09-13 MED ORDER — DOXYLAMINE-PYRIDOXINE 10-10 MG PO TBEC: DELAYED_RELEASE_TABLET | ORAL | 6 refills | Status: DC

## 2023-09-13 MED ORDER — BLOOD PRESSURE MONITOR MISC: 0 refills | Status: DC

## 2023-09-13 MED ORDER — ASPIRIN 81 MG PO TBEC: 81.0000 mg | DELAYED_RELEASE_TABLET | Freq: Every day | ORAL | 3 refills | Status: DC

## 2023-09-13 NOTE — Progress Notes (Signed)
 US  12+1 wks,measurements c/w dates,NB present,NT 1.4 mm,FHR 140 bpm,posterior placenta,normal ovaries,CRL 67.23 mm

## 2023-09-13 NOTE — Progress Notes (Signed)
 INITIAL OBSTETRICAL VISIT Patient name: Heidi Dean MRN [id number]  Date of birth: [birth year] Chief Complaint:   Initial Prenatal Visit  History of Present Illness:   Lanee Chain is a 23 y.o. G32P0000 Hispanic female at [redacted]w[redacted]d by US  at 6 weeks with an Estimated Date of Delivery: 03/26/24 being seen today for her initial obstetrical visit.   Patient's last menstrual period was 05/28/2023 (approximate). Her obstetrical history is significant for primigravida.   Today she reports n/v- requests meds.  Last pap 11/23/22. Results were: LSIL w/ HRHPV not done     09/13/2023    2:39 PM 11/23/2022    8:48 AM  Depression screen PHQ 2/9  Decreased Interest 1 0  Down, Depressed, Hopeless 0 0  PHQ - 2 Score 1 0  Altered sleeping 0 1  Tired, decreased energy 2 1  Change in appetite 1 0  Feeling bad or failure about yourself  0 0  Trouble concentrating 0 0  Moving slowly or fidgety/restless 0 0  Suicidal thoughts 0 0  PHQ-9 Score 4 2        09/13/2023    2:40 PM 11/23/2022    8:49 AM  GAD 7 : Generalized Anxiety Score  Nervous, Anxious, on Edge 0 0  Control/stop worrying 0 0  Worry too much - different things 0 1  Trouble relaxing 0 0  Restless 0 0  Easily annoyed or irritable 0 0  Afraid - awful might happen 0 0  Total GAD 7 Score 0 1     Review of Systems:   Pertinent items are noted in HPI Denies cramping/contractions, leakage of fluid, vaginal bleeding, abnormal vaginal discharge w/ itching/odor/irritation, headaches, visual changes, shortness of breath, chest pain, abdominal pain, severe nausea/vomiting, or problems with urination or bowel movements unless otherwise stated above.  Pertinent History Reviewed:  Reviewed past medical,surgical, social, obstetrical and family history.  Reviewed problem list, medications and allergies. OB History  Gravida Para Term Preterm AB Living  1 0 0 0 0 0  SAB IAB Ectopic Multiple Live Births  0 0 0 0 0    #  Outcome Date GA Lbr Len/2nd Weight Sex Type Anes PTL Lv  1 Current            Physical Assessment:   Vitals:   09/13/23 1451  BP: 107/61  Pulse: 86  Weight: 180 lb (81.6 kg)  Body mass index is 32.92 kg/m.       Physical Examination:  General appearance - well appearing, and in no distress  Mental status - alert, oriented to person, place, and time  Psych:  She has a normal mood and affect  Skin - warm and dry, normal color, no suspicious lesions noted  Chest - effort normal, all lung fields clear to auscultation bilaterally  Heart - normal rate and regular rhythm  Abdomen - soft, nontender  Extremities:  No swelling or varicosities noted  Thin prep pap is not done   Chaperone: N/A  TODAY'S NT US  12+1 wks,measurements c/w dates,NB present,NT 1.4 mm,FHR 140 bpm,posterior placenta,normal ovaries,CRL 67.23 mm   No results found for this or any previous visit (from the past 24 hours).  Assessment & Plan:  1) Low-Risk Pregnancy G1P0000 at [redacted]w[redacted]d with an Estimated Date of Delivery: 03/26/24   2) Initial OB visit  3) H/O abnormal pap> repeat after 8/28  4) N/V> rx diclegis  Meds:  Meds ordered this encounter  Medications   Blood Pressure Monitor MISC  Sig: For regular home bp monitoring during pregnancy    Dispense:  1 each    Refill:  0    Z34.81 Please mail to patient   aspirin EC 81 MG tablet    Sig: Take 1 tablet (81 mg total) by mouth daily. Swallow whole.    Dispense:  90 tablet    Refill:  3   Doxylamine-Pyridoxine (DICLEGIS) 10-10 MG TBEC    Sig: 2 tabs q hs, if sx persist add 1 tab q am on day 3, if sx persist add 1 tab q afternoon on day 4    Dispense:  100 tablet    Refill:  6    Initial labs obtained Continue prenatal vitamins Reviewed n/v relief measures and warning s/s to report Reviewed recommended weight gain based on pre-gravid BMI Encouraged well-balanced diet Genetic & carrier screening discussed: requests Panorama, NT/IT, and Horizon   Ultrasound discussed; fetal survey: requested CCNC completed> form faxed if has or is planning to apply for medicaid The nature of [employer] for [employer] with multiple MDs and other Advanced Practice Providers was explained to patient; also emphasized that fellows, residents, and students are part of our team. Does not have home bp cuff. Office bp cuff given: no. Rx sent: yes. Check bp weekly, let us  know if consistently >140/90.   Indications for ASA therapy (per uptodate) OR Two or more of the following: Nulliparity Yes Obesity (BMI>30 kg/m2) Yes  Follow-up: Return in about 4 weeks (around 10/11/2023) for LROB, 2nd IT, CNM, in person; 8wks from now anatomy u/s and LROB w/ CNM.   Orders Placed This Encounter  Procedures   Urine Culture   GC/Chlamydia Probe Amp   Integrated 1   CBC/D/Plt+RPR+Rh+ABO+RubIgG...   PANORAMA PRENATAL TEST   HORIZON CUSTOM   Hemoglobin A1c    Ferd Householder CNM, [facility] 09/13/2023 3:34 PM

## 2023-09-13 NOTE — Patient Instructions (Signed)
 Heidi Dean, thank you for choosing our office today! We appreciate the opportunity to meet your healthcare needs. You may receive a short survey by mail, e-mail, or through [employer]. If you are happy with your care we would appreciate if you could take just a few minutes to complete the survey questions. We read all of your comments and take your feedback very seriously. Thank you again for choosing our office.  Center for [employer] [facility] at [facility]  [facility] at [facility] ([address] [place], [place] [address]) Entrance C, located off of [address] Free 24/7 valet parking   Nausea & Vomiting Have saltine crackers or pretzels by your bed and eat a few bites before you raise your head out of bed in the morning Eat small frequent meals throughout the day instead of large meals Drink plenty of fluids throughout the day to stay hydrated, just don't drink a lot of fluids with your meals.  This can make your stomach fill up faster making you feel sick Do not brush your teeth right after you eat Products with real ginger are good for nausea, like ginger ale and ginger hard candy Make sure it says made with real ginger! Sucking on sour candy like lemon heads is also good for nausea If your prenatal vitamins make you nauseated, take them at night so you will sleep through the nausea Sea Bands If you feel like you need medicine for the nausea & vomiting please let us  know If you are unable to keep any fluids or food down please let us  know   Constipation Drink plenty of fluid, preferably water, throughout the day Eat foods high in fiber such as fruits, vegetables, and grains Exercise, such as walking, is a good way to keep your bowels regular Drink warm fluids, especially warm prune juice, or decaf coffee Eat a 1/2 cup of real oatmeal (not instant), 1/2 cup applesauce, and 1/2-1 cup warm prune juice every day If needed, you may take Colace (docusate sodium) stool softener  once or twice a day to help keep the stool soft.  If you still are having problems with constipation, you may take Miralax once daily as needed to help keep your bowels regular.   Home Blood Pressure Monitoring for Patients   Your provider has recommended that you check your blood pressure (BP) at least once a week at home. If you do not have a blood pressure cuff at home, one will be provided for you. Contact your provider if you have not received your monitor within 1 week.   Helpful Tips for Accurate Home Blood Pressure Checks  Don't smoke, exercise, or drink caffeine 30 minutes before checking your BP Use the restroom before checking your BP (a full bladder can raise your pressure) Relax in a comfortable upright chair Feet on the ground Left arm resting comfortably on a flat surface at the level of your heart Legs uncrossed Back supported Sit quietly and don't talk Place the cuff on your bare arm Adjust snuggly, so that only two fingertips can fit between your skin and the top of the cuff Check 2 readings separated by at least one minute Keep a log of your BP readings For a visual, please reference this diagram: http://ccnc.care/bpdiagram  Provider Name: [facility]     Phone: [phone]  Zone 1: ALL CLEAR  Continue to monitor your symptoms:  BP reading is less than 140 (top number) or less than 90 (bottom  number)  No right upper stomach pain No headaches or seeing spots No feeling nauseated or throwing up No swelling in face and hands  Zone 2: CAUTION Call your doctor's office for any of the following:  BP reading is greater than 140 (top number) or greater than 90 (bottom number)  Stomach pain under your ribs in the middle or right side Headaches or seeing spots Feeling nauseated or throwing up Swelling in face and hands  Zone 3: EMERGENCY  Seek immediate medical care if you have any of the following:  BP reading is greater than160 (top number) or greater than  110 (bottom number) Severe headaches not improving with Tylenol Serious difficulty catching your breath Any worsening symptoms from Zone 2    First Trimester of Pregnancy The first trimester of pregnancy is from week 1 until the end of week 12 (months 1 through 3). A week after a sperm fertilizes an egg, the egg will implant on the wall of the uterus. This embryo will begin to develop into a baby. Genes from you and your partner are forming the baby. The female genes determine whether the baby is a boy or a girl. At 6-8 weeks, the eyes and face are formed, and the heartbeat can be seen on ultrasound. At the end of 12 weeks, all the baby's organs are formed.  Now that you are pregnant, you will want to do everything you can to have a healthy baby. Two of the most important things are to get good prenatal care and to follow your health care provider's instructions. Prenatal care is all the medical care you receive before the baby's birth. This care will help prevent, find, and treat any problems during the pregnancy and childbirth. BODY CHANGES Your body goes through many changes during pregnancy. The changes vary from woman to woman.  You may gain or lose a couple of pounds at first. You may feel sick to your stomach (nauseous) and throw up (vomit). If the vomiting is uncontrollable, call your health care provider. You may tire easily. You may develop headaches that can be relieved by medicines approved by your health care provider. You may urinate more often. Painful urination may mean you have a bladder infection. You may develop heartburn as a result of your pregnancy. You may develop constipation because certain hormones are causing the muscles that push waste through your intestines to slow down. You may develop hemorrhoids or swollen, bulging veins (varicose veins). Your breasts may begin to grow larger and become tender. Your nipples may stick out more, and the tissue that surrounds them  (areola) may become darker. Your gums may bleed and may be sensitive to brushing and flossing. Dark spots or blotches (chloasma, mask of pregnancy) may develop on your face. This will likely fade after the baby is born. Your menstrual periods will stop. You may have a loss of appetite. You may develop cravings for certain kinds of food. You may have changes in your emotions from day to day, such as being excited to be pregnant or being concerned that something may go wrong with the pregnancy and baby. You may have more vivid and strange dreams. You may have changes in your hair. These can include thickening of your hair, rapid growth, and changes in texture. Some women also have hair loss during or after pregnancy, or hair that feels dry or thin. Your hair will most likely return to normal after your baby is born. WHAT TO EXPECT AT YOUR PRENATAL  VISITS During a routine prenatal visit: You will be weighed to make sure you and the baby are growing normally. Your blood pressure will be taken. Your abdomen will be measured to track your baby's growth. The fetal heartbeat will be listened to starting around week 10 or 12 of your pregnancy. Test results from any previous visits will be discussed. Your health care provider may ask you: How you are feeling. If you are feeling the baby move. If you have had any abnormal symptoms, such as leaking fluid, bleeding, severe headaches, or abdominal cramping. If you have any questions. Other tests that may be performed during your first trimester include: Blood tests to find your blood type and to check for the presence of any previous infections. They will also be used to check for low iron levels (anemia) and Rh antibodies. Later in the pregnancy, blood tests for diabetes will be done along with other tests if problems develop. Urine tests to check for infections, diabetes, or protein in the urine. An ultrasound to confirm the proper growth and development  of the baby. An amniocentesis to check for possible genetic problems. Fetal screens for spina bifida and Down syndrome. You may need other tests to make sure you and the baby are doing well. HOME CARE INSTRUCTIONS  Medicines Follow your health care provider's instructions regarding medicine use. Specific medicines may be either safe or unsafe to take during pregnancy. Take your prenatal vitamins as directed. If you develop constipation, try taking a stool softener if your health care provider approves. Diet Eat regular, well-balanced meals. Choose a variety of foods, such as meat or vegetable-based protein, fish, milk and low-fat dairy products, vegetables, fruits, and whole grain breads and cereals. Your health care provider will help you determine the amount of weight gain that is right for you. Avoid raw meat and uncooked cheese. These carry germs that can cause birth defects in the baby. Eating four or five small meals rather than three large meals a day may help relieve nausea and vomiting. If you start to feel nauseous, eating a few soda crackers can be helpful. Drinking liquids between meals instead of during meals also seems to help nausea and vomiting. If you develop constipation, eat more high-fiber foods, such as fresh vegetables or fruit and whole grains. Drink enough fluids to keep your urine clear or pale yellow. Activity and Exercise Exercise only as directed by your health care provider. Exercising will help you: Control your weight. Stay in shape. Be prepared for labor and delivery. Experiencing pain or cramping in the lower abdomen or low back is a good sign that you should stop exercising. Check with your health care provider before continuing normal exercises. Try to avoid standing for long periods of time. Move your legs often if you must stand in one place for a long time. Avoid heavy lifting. Wear low-heeled shoes, and practice good posture. You may continue to have sex  unless your health care provider directs you otherwise. Relief of Pain or Discomfort Wear a good support bra for breast tenderness.   Take warm sitz baths to soothe any pain or discomfort caused by hemorrhoids. Use hemorrhoid cream if your health care provider approves.   Rest with your legs elevated if you have leg cramps or low back pain. If you develop varicose veins in your legs, wear support hose. Elevate your feet for 15 minutes, 3-4 times a day. Limit salt in your diet. Prenatal Care Schedule your prenatal visits by the  twelfth week of pregnancy. They are usually scheduled monthly at first, then more often in the last 2 months before delivery. Write down your questions. Take them to your prenatal visits. Keep all your prenatal visits as directed by your health care provider. Safety Wear your seat belt at all times when driving. Make a list of emergency phone numbers, including numbers for family, friends, the hospital, and police and fire departments. General Tips Ask your health care provider for a referral to a local prenatal education class. Begin classes no later than at the beginning of month 6 of your pregnancy. Ask for help if you have counseling or nutritional needs during pregnancy. Your health care provider can offer advice or refer you to specialists for help with various needs. Do not use hot tubs, steam rooms, or saunas. Do not douche or use tampons or scented sanitary pads. Do not cross your legs for long periods of time. Avoid cat litter boxes and soil used by cats. These carry germs that can cause birth defects in the baby and possibly loss of the fetus by miscarriage or stillbirth. Avoid all smoking, herbs, alcohol, and medicines not prescribed by your health care provider. Chemicals in these affect the formation and growth of the baby. Schedule a dentist appointment. At home, brush your teeth with a soft toothbrush and be gentle when you floss. SEEK MEDICAL CARE IF:   You have dizziness. You have mild pelvic cramps, pelvic pressure, or nagging pain in the abdominal area. You have persistent nausea, vomiting, or diarrhea. You have a bad smelling vaginal discharge. You have pain with urination. You notice increased swelling in your face, hands, legs, or ankles. SEEK IMMEDIATE MEDICAL CARE IF:  You have a fever. You are leaking fluid from your vagina. You have spotting or bleeding from your vagina. You have severe abdominal cramping or pain. You have rapid weight gain or loss. You vomit blood or material that looks like coffee grounds. You are exposed to [place] measles and have never had them. You are exposed to fifth disease or chickenpox. You develop a severe headache. You have shortness of breath. You have any kind of trauma, such as from a fall or a car accident. Document Released: 03/08/2001 Document Revised: 07/29/2013 Document Reviewed: 01/22/2013 [facility] Patient Information 2015 [place], [place]. This information is not intended to replace advice given to you by your health care provider. Make sure you discuss any questions you have with your health care provider.

## 2023-09-14 LAB — INTEGRATED 1

## 2023-09-15 LAB — CBC/D/PLT+RPR+RH+ABO+RUBIGG...
Antibody Screen: NEGATIVE
Basophils Absolute: 0.1 10*3/uL (ref 0.0–0.2)
Basos: 0 %
EOS (ABSOLUTE): 0.2 10*3/uL (ref 0.0–0.4)
Eos: 1 %
HCV Ab: NONREACTIVE
HIV Screen 4th Generation wRfx: NONREACTIVE
Hematocrit: 41.1 % (ref 34.0–46.6)
Hemoglobin: 13.3 g/dL (ref 11.1–15.9)
Hepatitis B Surface Ag: NEGATIVE
Immature Grans (Abs): 0.1 10*3/uL (ref 0.0–0.1)
Immature Granulocytes: 1 %
Lymphocytes Absolute: 2.2 10*3/uL (ref 0.7–3.1)
Lymphs: 19 %
MCH: 29.8 pg (ref 26.6–33.0)
MCHC: 32.4 g/dL (ref 31.5–35.7)
MCV: 92 fL (ref 79–97)
Monocytes Absolute: 0.6 10*3/uL (ref 0.1–0.9)
Monocytes: 5 %
Neutrophils Absolute: 8.6 10*3/uL — ABNORMAL HIGH (ref 1.4–7.0)
Neutrophils: 74 %
Platelets: 300 10*3/uL (ref 150–450)
RBC: 4.46 x10E6/uL (ref 3.77–5.28)
RDW: 13.4 % (ref 11.7–15.4)
RPR Ser Ql: NONREACTIVE
Rh Factor: POSITIVE
Rubella Antibodies, IGG: 3.95 {index} (ref >0.99)
WBC: 11.7 10*3/uL — ABNORMAL HIGH (ref 3.4–10.8)

## 2023-09-15 LAB — INTEGRATED 1
Crown Rump Length: 67.2 mm
Gest. Age on Collection Date: 12.9 wk
Nuchal Translucency (NT): 1.4 mm
Number of Fetuses: 1
PAPP-A Value: 450.5 ng/mL
Sonographer ID#: 24.9 a
Sonographer ID#: 309760
Weight: 180 [lb_av]

## 2023-09-15 LAB — GC/CHLAMYDIA PROBE AMP
Chlamydia trachomatis, NAA: NEGATIVE
Neisseria Gonorrhoeae by PCR: NEGATIVE

## 2023-09-15 LAB — URINE CULTURE

## 2023-09-15 LAB — HCV INTERPRETATION

## 2023-09-15 LAB — HEMOGLOBIN A1C
Est. average glucose Bld gHb Est-mCnc: 105 mg/dL
Hgb A1c MFr Bld: 5.3 % (ref 4.8–5.6)

## 2023-09-20 ENCOUNTER — Ambulatory Visit: Payer: Self-pay | Admitting: Women's Health

## 2023-09-20 DIAGNOSIS — Z3400 Encounter for supervision of normal first pregnancy, unspecified trimester: Secondary | ICD-10-CM

## 2023-09-20 LAB — PANORAMA PRENATAL TEST FULL PANEL:PANORAMA TEST PLUS 5 ADDITIONAL MICRODELETIONS: FETAL FRACTION: 6.7

## 2023-09-20 LAB — HORIZON CUSTOM: REPORT SUMMARY: NEGATIVE

## 2023-10-11 ENCOUNTER — Ambulatory Visit (INDEPENDENT_AMBULATORY_CARE_PROVIDER_SITE_OTHER): Admitting: Advanced Practice Midwife

## 2023-10-11 ENCOUNTER — Encounter: Payer: Self-pay | Admitting: Advanced Practice Midwife

## 2023-10-11 VITALS — BP 112/73 | HR 73 | Wt 181.0 lb

## 2023-10-11 DIAGNOSIS — Z3A16 16 weeks gestation of pregnancy: Secondary | ICD-10-CM | POA: Diagnosis not present

## 2023-10-11 DIAGNOSIS — Z3402 Encounter for supervision of normal first pregnancy, second trimester: Secondary | ICD-10-CM

## 2023-10-11 DIAGNOSIS — Z3400 Encounter for supervision of normal first pregnancy, unspecified trimester: Secondary | ICD-10-CM

## 2023-10-11 MED ORDER — METOCLOPRAMIDE HCL 10 MG PO TABS: 10.0000 mg | ORAL_TABLET | Freq: Four times a day (QID) | ORAL | 3 refills | Status: DC

## 2023-10-11 NOTE — Progress Notes (Addendum)
   LOW-RISK PREGNANCY VISIT- visit w in person Spanish interpreter, [place] Patient name: Heidi Dean MRN [id number]  Date of birth: 2000/08/12 Chief Complaint:   Routine Prenatal Visit (2 NT)  History of Present Illness:   Heidi Dean is a 23 y.o. G57P0000 female at [redacted]w[redacted]d with an Estimated Date of Delivery: 03/26/24 being seen today for ongoing management of a low-risk pregnancy.  Today she reports both reflux and N/V; Diclegis  doesn't seem to be helping; also notes extreme fatigue. Contractions: Not present. Vag. Bleeding: None.   . denies leaking of fluid. Review of Systems:   Pertinent items are noted in HPI Denies abnormal vaginal discharge w/ itching/odor/irritation, headaches, visual changes, shortness of breath, chest pain, abdominal pain, severe nausea/vomiting, or problems with urination or bowel movements unless otherwise stated above. Pertinent History Reviewed:  Reviewed past medical,surgical, social, obstetrical and family history.  Reviewed problem list, medications and allergies. Physical Assessment:   Vitals:   10/11/23 1507  BP: 112/73  Pulse: 73  Weight: 181 lb (82.1 kg)  Body mass index is 33.11 kg/m.        Physical Examination:   General appearance: Well appearing, and in no distress  Mental status: Alert, oriented to person, place, and time  Skin: Warm & dry  Cardiovascular: Normal heart rate noted  Respiratory: Normal respiratory effort, no distress  Abdomen: Soft, gravid, nontender  Pelvic: Cervical exam deferred         Extremities:    Fetal Status: Fetal Heart Rate (bpm): 140        No results found for this or any previous visit (from the past 24 hours).  Assessment & Plan:  1) Low-risk pregnancy G1P0000 at [redacted]w[redacted]d with an Estimated Date of Delivery: 03/26/24   2) Reflux and N/V, declines Zofran  and Phenergan  due to side effects; willing to try scheduled Reglan  and to supplement w OTC Tums prn   Meds:  Meds ordered this  encounter  Medications   metoCLOPramide  (REGLAN ) 10 MG tablet    Sig: Take 1 tablet (10 mg total) by mouth 4 (four) times daily.    Dispense:  120 tablet    Refill:  3    Supervising Provider:   MARILYNN NEST [[id number]]   Labs/procedures today: 2nd IT  Plan:  Continue routine obstetrical care   Reviewed: Preterm labor symptoms and general obstetric precautions including but not limited to vaginal bleeding, contractions, leaking of fluid and fetal movement were reviewed in detail with the patient.  All questions were answered. Didn't ask about home bp cuff. Check bp weekly, let us  know if >140/90.   Follow-up: Return for As scheduled.[name] u/s)  Orders Placed This Encounter  Procedures   INTEGRATED 2   [name] [facility] 10/11/2023 3:48 PM

## 2023-10-11 NOTE — Patient Instructions (Signed)
 Jazariah, thank you for choosing our office today! We appreciate the opportunity to meet your healthcare needs. You may receive a short survey by mail, e-mail, or through [employer]. If you are happy with your care we would appreciate if you could take just a few minutes to complete the survey questions. We read all of your comments and take your feedback very seriously. Thank you again for choosing our office.  Center for [employer] Team at [facility] at [facility] ([address] [place], [place] [address]) Entrance C, located off of [address] Free 24/7 valet parking  Go to [email or web address] to register for FREE online childbirth classes  Call the office [phone]) or go to [facility] if: You begin to severe cramping Your water breaks.  Sometimes it is a big gush of fluid, sometimes it is just a trickle that keeps getting your panties wet or running down your legs You have vaginal bleeding.  It is normal to have a small amount of spotting if your cervix was checked.   [facility] Pediatricians/Family Doctors [facility]): [address] Luba BROCKS, [phone]           [facility]: [address], [phone]                [facility]): [address], [phone] (call to ask if accepting patients) [facility] Department: [address], [place], [phone]    [facility] Pediatricians/Family Doctors [facility]): [phone], [phone] [facility]: [address] [place], [phone] [facility]: [address], [phone]  [facility] Doctors  [facility]): [phone] [facility]: [address], [phone]   [facility] Doctors [facility]: [address], [phone]  [facility] Doctors  [facility]: [phone], [phone]  Home Blood Pressure Monitoring for Patients   Your provider has recommended that you check your blood pressure (BP) at least once a week at home. If you do not have a blood pressure cuff at home, one will be provided for you. Contact your provider if you have not received your monitor within 1 week.   Helpful Tips for Accurate Home Blood Pressure Checks  Don't smoke, exercise, or drink caffeine 30 minutes before checking your BP Use the restroom before checking your BP (a full bladder can raise your pressure) Relax in a comfortable upright chair Feet on the ground Left arm resting comfortably on a flat surface at the level of your heart Legs uncrossed Back supported Sit quietly and don't talk Place the cuff on your bare arm Adjust snuggly, so that only two fingertips can fit between your skin and the top of the cuff Check 2 readings separated by at least one minute Keep a log of your BP readings For a visual, please reference this diagram: http://ccnc.care/bpdiagram  Provider Name: [facility]     Phone: [phone]  Zone 1: ALL CLEAR  Continue to monitor your symptoms:  BP reading is less than 140 (top number) or less than 90 (bottom number)  No right upper stomach pain No headaches or seeing spots No feeling nauseated or throwing up No swelling in face and hands  Zone 2: CAUTION Call your doctor's office for any of the following:  BP reading is greater than 140 (top number) or greater than  90 (bottom number)  Stomach pain under your ribs in the middle or right side Headaches or seeing spots Feeling nauseated or throwing up Swelling in face and hands  Zone 3: EMERGENCY  Seek immediate medical care if you have any of the following:  BP reading is greater than160 (top number) or greater than 110 (bottom number) Severe headaches not improving with Tylenol Serious difficulty catching your breath Any worsening symptoms from  Zone 2     Second Trimester of Pregnancy The second trimester is from week 14 through week 27 (months 4 through 6). The second trimester is often a time when you feel your best. Your body has adjusted to being pregnant, and you begin to feel better physically. Usually, morning sickness has lessened or quit completely, you may have more energy, and you may have an increase in appetite. The second trimester is also a time when the fetus is growing rapidly. At the end of the sixth month, the fetus is about 9 inches long and weighs about 1 pounds. You will likely begin to feel the baby move (quickening) between 16 and 20 weeks of pregnancy. Body changes during your second trimester Your body continues to go through many changes during your second trimester. The changes vary from woman to woman. Your weight will continue to increase. You will notice your lower abdomen bulging out. You may begin to get stretch marks on your hips, abdomen, and breasts. You may develop headaches that can be relieved by medicines. The medicines should be approved by your health care provider. You may urinate more often because the fetus is pressing on your bladder. You may develop or continue to have heartburn as a result of your pregnancy. You may develop constipation because certain hormones are causing the muscles that push waste through your intestines to slow down. You may develop hemorrhoids or swollen, bulging veins (varicose veins). You may have back pain. This is caused by: Weight gain. Pregnancy hormones that are relaxing the joints in your pelvis. A shift in weight and the muscles that support your balance. Your breasts will continue to grow and they will continue to become tender. Your gums may bleed and may be sensitive to brushing and flossing. Dark spots or blotches (chloasma, mask of pregnancy) may develop on your face. This will likely fade after the baby is born. A dark line from your belly button to  the pubic area (linea nigra) may appear. This will likely fade after the baby is born. You may have changes in your hair. These can include thickening of your hair, rapid growth, and changes in texture. Some women also have hair loss during or after pregnancy, or hair that feels dry or thin. Your hair will most likely return to normal after your baby is born.  What to expect at prenatal visits During a routine prenatal visit: You will be weighed to make sure you and the fetus are growing normally. Your blood pressure will be taken. Your abdomen will be measured to track your baby's growth. The fetal heartbeat will be listened to. Any test results from the previous visit will be discussed.  Your health care provider may ask you: How you are feeling. If you are feeling the baby move. If you have had any abnormal symptoms, such as leaking fluid, bleeding, severe headaches, or abdominal cramping. If you are using any tobacco products, including cigarettes, chewing tobacco, and electronic cigarettes. If you have any questions.  Other tests that may be performed during  your second trimester include: Blood tests that check for: Low iron levels (anemia). High blood sugar that affects pregnant women (gestational diabetes) between 26 and 28 weeks. Rh antibodies. This is to check for a protein on red blood cells (Rh factor). Urine tests to check for infections, diabetes, or protein in the urine. An ultrasound to confirm the proper growth and development of the baby. An amniocentesis to check for possible genetic problems. Fetal screens for spina bifida and Down syndrome. HIV (human immunodeficiency virus) testing. Routine prenatal testing includes screening for HIV, unless you choose not to have this test.  Follow these instructions at home: Medicines Follow your health care provider's instructions regarding medicine use. Specific medicines may be either safe or unsafe to take during  pregnancy. Take a prenatal vitamin that contains at least 600 micrograms (mcg) of folic acid. If you develop constipation, try taking a stool softener if your health care provider approves. Eating and drinking Eat a balanced diet that includes fresh fruits and vegetables, whole grains, good sources of protein such as meat, eggs, or tofu, and low-fat dairy. Your health care provider will help you determine the amount of weight gain that is right for you. Avoid raw meat and uncooked cheese. These carry germs that can cause birth defects in the baby. If you have low calcium intake from food, talk to your health care provider about whether you should take a daily calcium supplement. Limit foods that are high in fat and processed sugars, such as fried and sweet foods. To prevent constipation: Drink enough fluid to keep your urine clear or pale yellow. Eat foods that are high in fiber, such as fresh fruits and vegetables, whole grains, and beans. Activity Exercise only as directed by your health care provider. Most women can continue their usual exercise routine during pregnancy. Try to exercise for 30 minutes at least 5 days a week. Stop exercising if you experience uterine contractions. Avoid heavy lifting, wear low heel shoes, and practice good posture. A sexual relationship may be continued unless your health care provider directs you otherwise. Relieving pain and discomfort Wear a good support bra to prevent discomfort from breast tenderness. Take warm sitz baths to soothe any pain or discomfort caused by hemorrhoids. Use hemorrhoid cream if your health care provider approves. Rest with your legs elevated if you have leg cramps or low back pain. If you develop varicose veins, wear support hose. Elevate your feet for 15 minutes, 3-4 times a day. Limit salt in your diet. Prenatal Care Write down your questions. Take them to your prenatal visits. Keep all your prenatal visits as told by your health  care provider. This is important. Safety Wear your seat belt at all times when driving. Make a list of emergency phone numbers, including numbers for family, friends, the hospital, and police and fire departments. General instructions Ask your health care provider for a referral to a local prenatal education class. Begin classes no later than the beginning of month 6 of your pregnancy. Ask for help if you have counseling or nutritional needs during pregnancy. Your health care provider can offer advice or refer you to specialists for help with various needs. Do not use hot tubs, steam rooms, or saunas. Do not douche or use tampons or scented sanitary pads. Do not cross your legs for long periods of time. Avoid cat litter boxes and soil used by cats. These carry germs that can cause birth defects in the baby and possibly loss of the  fetus by miscarriage or stillbirth. Avoid all smoking, herbs, alcohol, and unprescribed drugs. Chemicals in these products can affect the formation and growth of the baby. Do not use any products that contain nicotine or tobacco, such as cigarettes and e-cigarettes. If you need help quitting, ask your health care provider. Visit your dentist if you have not gone yet during your pregnancy. Use a soft toothbrush to brush your teeth and be gentle when you floss. Contact a health care provider if: You have dizziness. You have mild pelvic cramps, pelvic pressure, or nagging pain in the abdominal area. You have persistent nausea, vomiting, or diarrhea. You have a bad smelling vaginal discharge. You have pain when you urinate. Get help right away if: You have a fever. You are leaking fluid from your vagina. You have spotting or bleeding from your vagina. You have severe abdominal cramping or pain. You have rapid weight gain or weight loss. You have shortness of breath with chest pain. You notice sudden or extreme swelling of your face, hands, ankles, feet, or legs. You  have not felt your baby move in over an hour. You have severe headaches that do not go away when you take medicine. You have vision changes. Summary The second trimester is from week 14 through week 27 (months 4 through 6). It is also a time when the fetus is growing rapidly. Your body goes through many changes during pregnancy. The changes vary from woman to woman. Avoid all smoking, herbs, alcohol, and unprescribed drugs. These chemicals affect the formation and growth your baby. Do not use any tobacco products, such as cigarettes, chewing tobacco, and e-cigarettes. If you need help quitting, ask your health care provider. Contact your health care provider if you have any questions. Keep all prenatal visits as told by your health care provider. This is important. This information is not intended to replace advice given to you by your health care provider. Make sure you discuss any questions you have with your health care provider. Document Released: 03/08/2001 Document Revised: 08/20/2015 Document Reviewed: 05/15/2012 Elsevier Interactive Patient Education  2017 [employer].

## 2023-10-13 LAB — INTEGRATED 2
AFP MoM: 1.01
Alpha-Fetoprotein: 31.2 ng/mL
Crown Rump Length: 67.2 mm
DIA MoM: 0.71
DIA Value: 100.5 pg/mL
Estriol, Unconjugated: 1.37 ng/mL
Gest. Age on Collection Date: 12.9 wk
Gestational Age: 16.9 wk
Maternal Age at EDD: 24.9 a
Nuchal Translucency (NT): 1.4 mm
Nuchal Translucency MoM: 0.78
Number of Fetuses: 1
PAPP-A MoM: 0.61
PAPP-A Value: 450.5 ng/mL
Sonographer ID#: 309760
Test Results:: NEGATIVE
Weight: 180 [lb_av]
Weight: 180 [lb_av]
hCG MoM: 0.68
hCG Value: 19.1 [IU]/mL
uE3 MoM: 1.16

## 2023-10-15 ENCOUNTER — Ambulatory Visit: Payer: Self-pay | Admitting: Advanced Practice Midwife

## 2023-11-03 ENCOUNTER — Other Ambulatory Visit: Payer: Self-pay | Admitting: Obstetrics & Gynecology

## 2023-11-03 DIAGNOSIS — Z363 Encounter for antenatal screening for malformations: Secondary | ICD-10-CM

## 2023-11-08 ENCOUNTER — Encounter: Payer: Self-pay | Admitting: Advanced Practice Midwife

## 2023-11-08 ENCOUNTER — Ambulatory Visit: Admitting: Advanced Practice Midwife

## 2023-11-08 ENCOUNTER — Ambulatory Visit

## 2023-11-08 VITALS — BP 108/71 | HR 75 | Wt 186.0 lb

## 2023-11-08 DIAGNOSIS — Z3402 Encounter for supervision of normal first pregnancy, second trimester: Secondary | ICD-10-CM

## 2023-11-08 DIAGNOSIS — Z3400 Encounter for supervision of normal first pregnancy, unspecified trimester: Secondary | ICD-10-CM

## 2023-11-08 DIAGNOSIS — Z3A20 20 weeks gestation of pregnancy: Secondary | ICD-10-CM | POA: Diagnosis not present

## 2023-11-08 DIAGNOSIS — Z363 Encounter for antenatal screening for malformations: Secondary | ICD-10-CM

## 2023-11-08 NOTE — Progress Notes (Signed)
 US  20+1 wks,breech,CX 4.7 cm,posterior placenta gr 0,normal ovaries,SVP of fluid 5.4 cm,FHR 148 bpm,EFW 374 g 77%,anatomy complete,no obvious abnormalities

## 2023-11-08 NOTE — Progress Notes (Signed)
   LOW-RISK PREGNANCY VISIT- visit w in-person Spanish interpreter, Bartholome Patient name: Heidi Dean MRN [id number]  Date of birth: Jan 11, 2001 Chief Complaint:   Routine Prenatal Visit  History of Present Illness:   Heidi Dean is a 23 y.o. G57P0000 female at [redacted]w[redacted]d with an Estimated Date of Delivery: 03/26/24 being seen today for ongoing management of a low-risk pregnancy.  Today she reports N/V doing much better. Contractions: Not present. Vag. Bleeding: None.  Movement: Present. denies leaking of fluid. Review of Systems:   Pertinent items are noted in HPI Denies abnormal vaginal discharge w/ itching/odor/irritation, headaches, visual changes, shortness of breath, chest pain, abdominal pain, severe nausea/vomiting, or problems with urination or bowel movements unless otherwise stated above. Pertinent History Reviewed:  Reviewed past medical,surgical, social, obstetrical and family history.  Reviewed problem list, medications and allergies. Physical Assessment:   Vitals:   11/08/23 1545  BP: 108/71  Pulse: 75  Weight: 186 lb (84.4 kg)  Body mass index is 34.02 kg/m.        Physical Examination:   General appearance: Well appearing, and in no distress  Mental status: Alert, oriented to person, place, and time  Skin: Warm & dry  Cardiovascular: Normal heart rate noted  Respiratory: Normal respiratory effort, no distress  Abdomen: Soft, gravid, nontender  Pelvic: Cervical exam deferred         Extremities:    Fetal Status: Fetal Heart Rate (bpm): 148 u/s   Movement: Present    Anatomy u/s: US  20+1 wks,breech,CX 4.7 cm,posterior placenta gr 0,normal ovaries,SVP of fluid 5.4 cm,FHR 148 bpm,EFW 374 g 77%,anatomy complete,no obvious abnormalities   No results found for this or any previous visit (from the past 24 hours).  Assessment & Plan:  1) Low-risk pregnancy G1P0000 at [redacted]w[redacted]d with an Estimated Date of Delivery: 03/26/24   2) N/V much improved, now just  feeling bad sometimes in the morning  3) Hx abnl Pap (LSIL), needs repeat @ next visit   Meds: No orders of the defined types were placed in this encounter.  Labs/procedures today: anatomy u/s  Plan:  Continue routine obstetrical care   Reviewed: Preterm labor symptoms and general obstetric precautions including but not limited to vaginal bleeding, contractions, leaking of fluid and fetal movement were reviewed in detail with the patient.  All questions were answered. Didn't ask about home bp cuff. Check bp weekly, let us  know if >140/90.   Follow-up: Return in about 4 weeks (around 12/06/2023) for LROB, in person.  No orders of the defined types were placed in this encounter.  [name] CNM 11/08/2023 4:04 PM

## 2023-11-28 ENCOUNTER — Ambulatory Visit: Admitting: Adult Health

## 2023-12-06 ENCOUNTER — Other Ambulatory Visit (HOSPITAL_COMMUNITY)
Admission: RE | Admit: 2023-12-06 | Discharge: 2023-12-06 | Disposition: A | Discharge status: Home / Self Care | Source: Ambulatory Visit | Attending: Women's Health | Admitting: Women's Health

## 2023-12-06 ENCOUNTER — Encounter: Payer: Self-pay | Admitting: Women's Health

## 2023-12-06 ENCOUNTER — Ambulatory Visit: Admitting: Women's Health

## 2023-12-06 VITALS — BP 113/73 | HR 85 | Wt 187.8 lb

## 2023-12-06 DIAGNOSIS — Z124 Encounter for screening for malignant neoplasm of cervix: Secondary | ICD-10-CM

## 2023-12-06 DIAGNOSIS — Z8742 Personal history of other diseases of the female genital tract: Secondary | ICD-10-CM

## 2023-12-06 DIAGNOSIS — Z3400 Encounter for supervision of normal first pregnancy, unspecified trimester: Secondary | ICD-10-CM

## 2023-12-06 DIAGNOSIS — Z3482 Encounter for supervision of other normal pregnancy, second trimester: Secondary | ICD-10-CM | POA: Diagnosis not present

## 2023-12-06 DIAGNOSIS — R300 Dysuria: Secondary | ICD-10-CM | POA: Diagnosis not present

## 2023-12-06 DIAGNOSIS — Z3A24 24 weeks gestation of pregnancy: Secondary | ICD-10-CM | POA: Diagnosis not present

## 2023-12-06 LAB — POCT URINALYSIS DIPSTICK
Blood, UA: NEGATIVE
Glucose, UA: NEGATIVE
Ketones, UA: NEGATIVE
Leukocytes, UA: NEGATIVE
Nitrite, UA: NEGATIVE
Protein, UA: NEGATIVE

## 2023-12-06 NOTE — Patient Instructions (Addendum)
 Terrilee, thank you for choosing our office today! We appreciate the opportunity to meet your healthcare needs. You may receive a short survey by mail, e-mail, or through [employer]. If you are happy with your care we would appreciate if you could take just a few minutes to complete the survey questions. We read all of your comments and take your feedback very seriously. Thank you again for choosing our office.  Center for [employer] [facility] at [facility]  [facility] at [facility] ([address] [place], [place] [address]) Entrance C, located off of [address] Free 24/7 valet parking   You will have your sugar test next visit.  Please do not eat or drink anything after midnight the night before you come, not even water.  You will be here for at least two hours.  Please make an appointment online for the bloodwork at [email or web address] for 8:00am (or as close to this as possible). Make sure you select the [facility] service center.   Las medicinas seguras para tomar Academic librarian  Safe Medications in Pregnancy  Resfriados/Tos/Alergias:  Benadryl (sin alcohol) 25 mg cada 6 horas segn lo necesite Breath Right strips (Tiras para respirar correctamente)  Claritin  Cepacol (pastillas de chupar para la garganta)  Chloraseptic (aerosol para la garganta)  Cold-Eeze- hasta tres veces por da  Cough drops (pastillas de chupar para la tos, sin alcohol)  Flonase  (con receta mdica solamente)  Guaifenesin  Mucinex  Robitussin DM (simple solamente, sin alcohol)  Saline nasal spray/drops (Aerosol nasal salino/gotas) Sudafed (pseudoephedrine) y  Actifed * utilizar slo despus de 12 semanas de gestacin y si no tiene la presin arterial alta.  Tylenol Vicks  VapoRub  Zinc lozenges (pastillas para la garganta)  Zyrtec   CLASSES: Go to [email or web address] to register for classes (childbirth, breastfeeding, waterbirth, infant CPR, daddy bootcamp, etc.)  Call the office [phone]) or go  to [facility] if: You begin to have strong, frequent contractions Your water breaks.  Sometimes it is a big gush of fluid, sometimes it is just a trickle that keeps getting your panties wet or running down your legs You have vaginal bleeding.  It is normal to have a small amount of spotting if your cervix was checked.  You don't feel your baby moving like normal.  If you don't, get you something to eat and drink and lay down and focus on feeling your baby move.   If your baby is still not moving like normal, you should call the office or go to [facility].  Call the office [phone]) or go to [facility] for these signs of pre-eclampsia: Severe headache that does not go away with Tylenol Visual changes- seeing spots, double, blurred vision Pain under your right breast or upper abdomen that does not go away with Tums or heartburn medicine Nausea and/or vomiting Severe swelling in your hands, feet, and face    [place] Pediatricians/Family Doctors [facility]): [address] Luba BROCKS, [phone]           [facility]: [address], [phone]                [facility]): [address], [phone]  [facility] Department: [address], [place], [phone]    [facility] Pediatricians/Family Doctors [facility]): [address], [phone] [facility]: [address] [place], [phone] [facility]:  [address], [phone]  [facility] Doctors  [facility]): [phone] [facility]: [address] [phone]   [facility] Doctors [facility]: [address] [phone]  [facility] Doctors  [facility]: [phone], [phone]  Home Blood Pressure Monitoring for Patients   Your provider has recommended  that you check your blood pressure (BP) at least once a week at home. If you do not have a blood pressure cuff at home, one will be provided for you. Contact your provider if you have not received your monitor within 1 week.   Helpful Tips for Accurate Home Blood Pressure Checks  Don't smoke, exercise, or drink caffeine 30 minutes before checking your BP Use the restroom before checking your BP (a full bladder can raise your pressure) Relax in a comfortable upright chair Feet on the ground Left arm resting comfortably on a flat surface at the level of your heart Legs uncrossed Back supported Sit quietly and don't talk Place the cuff on your bare arm Adjust snuggly, so that only two fingertips can fit between your skin and the top of the cuff Check 2 readings separated by at least one minute Keep a log of your BP readings For a visual, please reference this diagram: http://ccnc.care/bpdiagram  Provider Name: [facility]     Phone: [phone]  Zone 1: ALL CLEAR  Continue to monitor your symptoms:  BP reading is less than 140 (top number) or less than 90 (bottom number)  No right upper stomach pain No headaches or seeing spots No feeling nauseated or throwing up No swelling in face and hands  Zone 2: CAUTION Call your doctor's office for any of the following:  BP reading is greater than 140 (top number) or greater than 90 (bottom number)  Stomach pain under your ribs in the middle or right side Headaches or seeing spots Feeling nauseated or throwing up Swelling in face and hands  Zone 3: EMERGENCY  Seek immediate medical care if you have any of the following:  BP reading is greater than160 (top number) or greater than 110 (bottom number) Severe headaches not improving with Tylenol Serious difficulty catching your breath Any worsening symptoms from Zone 2   Second Trimester of Pregnancy The second trimester is from week 13 through week 28, months 4 through 6. The  second trimester is often a time when you feel your best. Your body has also adjusted to being pregnant, and you begin to feel better physically. Usually, morning sickness has lessened or quit completely, you may have more energy, and you may have an increase in appetite. The second trimester is also a time when the fetus is growing rapidly. At the end of the sixth month, the fetus is about 9 inches long and weighs about 1 pounds. You will likely begin to feel the baby move (quickening) between 18 and 20 weeks of the pregnancy. BODY CHANGES Your body goes through many changes during pregnancy. The changes vary from woman to woman.  Your weight will continue to increase. You will notice your lower abdomen bulging out. You may begin to get stretch marks on your hips, abdomen, and breasts. You may develop headaches that can be relieved by medicines approved by your health care provider. You may urinate more often because the fetus is pressing on your bladder. You may develop or continue to have heartburn as a result of your pregnancy. You may  develop constipation because certain hormones are causing the muscles that push waste through your intestines to slow down. You may develop hemorrhoids or swollen, bulging veins (varicose veins). You may have back pain because of the weight gain and pregnancy hormones relaxing your joints between the bones in your pelvis and as a result of a shift in weight and the muscles that support your balance. Your breasts will continue to grow and be tender. Your gums may bleed and may be sensitive to brushing and flossing. Dark spots or blotches (chloasma, mask of pregnancy) may develop on your face. This will likely fade after the baby is born. A dark line from your belly button to the pubic area (linea nigra) may appear. This will likely fade after the baby is born. You may have changes in your hair. These can include thickening of your hair, rapid growth, and changes in  texture. Some women also have hair loss during or after pregnancy, or hair that feels dry or thin. Your hair will most likely return to normal after your baby is born. WHAT TO EXPECT AT YOUR PRENATAL VISITS During a routine prenatal visit: You will be weighed to make sure you and the fetus are growing normally. Your blood pressure will be taken. Your abdomen will be measured to track your baby's growth. The fetal heartbeat will be listened to. Any test results from the previous visit will be discussed. Your health care provider may ask you: How you are feeling. If you are feeling the baby move. If you have had any abnormal symptoms, such as leaking fluid, bleeding, severe headaches, or abdominal cramping. If you have any questions. Other tests that may be performed during your second trimester include: Blood tests that check for: Low iron levels (anemia). Gestational diabetes (between 24 and 28 weeks). Rh antibodies. Urine tests to check for infections, diabetes, or protein in the urine. An ultrasound to confirm the proper growth and development of the baby. An amniocentesis to check for possible genetic problems. Fetal screens for spina bifida and Down syndrome. HOME CARE INSTRUCTIONS  Avoid all smoking, herbs, alcohol, and unprescribed drugs. These chemicals affect the formation and growth of the baby. Follow your health care provider's instructions regarding medicine use. There are medicines that are either safe or unsafe to take during pregnancy. Exercise only as directed by your health care provider. Experiencing uterine cramps is a good sign to stop exercising. Continue to eat regular, healthy meals. Wear a good support bra for breast tenderness. Do not use hot tubs, steam rooms, or saunas. Wear your seat belt at all times when driving. Avoid raw meat, uncooked cheese, cat litter boxes, and soil used by cats. These carry germs that can cause birth defects in the baby. Take your  prenatal vitamins. Try taking a stool softener (if your health care provider approves) if you develop constipation. Eat more high-fiber foods, such as fresh vegetables or fruit and whole grains. Drink plenty of fluids to keep your urine clear or pale yellow. Take warm sitz baths to soothe any pain or discomfort caused by hemorrhoids. Use hemorrhoid cream if your health care provider approves. If you develop varicose veins, wear support hose. Elevate your feet for 15 minutes, 3-4 times a day. Limit salt in your diet. Avoid heavy lifting, wear low heel shoes, and practice good posture. Rest with your legs elevated if you have leg cramps or low back pain. Visit your dentist if you have not gone yet during your pregnancy. Use a  soft toothbrush to brush your teeth and be gentle when you floss. A sexual relationship may be continued unless your health care provider directs you otherwise. Continue to go to all your prenatal visits as directed by your health care provider. SEEK MEDICAL CARE IF:  You have dizziness. You have mild pelvic cramps, pelvic pressure, or nagging pain in the abdominal area. You have persistent nausea, vomiting, or diarrhea. You have a bad smelling vaginal discharge. You have pain with urination. SEEK IMMEDIATE MEDICAL CARE IF:  You have a fever. You are leaking fluid from your vagina. You have spotting or bleeding from your vagina. You have severe abdominal cramping or pain. You have rapid weight gain or loss. You have shortness of breath with chest pain. You notice sudden or extreme swelling of your face, hands, ankles, feet, or legs. You have not felt your baby move in over an hour. You have severe headaches that do not go away with medicine. You have vision changes. Document Released: 03/08/2001 Document Revised: 03/19/2013 Document Reviewed: 05/15/2012 [facility] Patient Information 2015 [place], [place]. This information is not intended to replace advice given to you by  your health care provider. Make sure you discuss any questions you have with your health care provider.

## 2023-12-06 NOTE — Progress Notes (Signed)
 LOW-RISK PREGNANCY VISIT Patient name: Heidi Dean MRN [id number]  Date of birth: Aug 09, 2000 Chief Complaint:   Routine Prenatal Visit  History of Present Illness:   Heidi Dean is a 23 y.o. G16P0000 female at [redacted]w[redacted]d with an Estimated Date of Delivery: 03/26/24 being seen today for ongoing management of a low-risk pregnancy.   Today she reports pelvic pain when walking or laying.Woke up w/ scratchy throat like may be getting a cold.  Contractions: Not present. Vag. Bleeding: None.  Movement: Present. denies leaking of fluid.     09/13/2023    2:39 PM 11/23/2022    8:48 AM  Depression screen PHQ 2/9  Decreased Interest 1 0  Down, Depressed, Hopeless 0 0  PHQ - 2 Score 1 0  Altered sleeping 0 1  Tired, decreased energy 2 1  Change in appetite 1 0  Feeling bad or failure about yourself  0 0  Trouble concentrating 0 0  Moving slowly or fidgety/restless 0 0  Suicidal thoughts 0 0  PHQ-9 Score 4 2        09/13/2023    2:40 PM 11/23/2022    8:49 AM  GAD 7 : Generalized Anxiety Score  Nervous, Anxious, on Edge 0 0  Control/stop worrying 0 0  Worry too much - different things 0 1  Trouble relaxing 0 0  Restless 0 0  Easily annoyed or irritable 0 0  Afraid - awful might happen 0 0  Total GAD 7 Score 0 1      Review of Systems:   Pertinent items are noted in HPI Denies abnormal vaginal discharge w/ itching/odor/irritation, headaches, visual changes, shortness of breath, chest pain, abdominal pain, severe nausea/vomiting, or problems with urination or bowel movements unless otherwise stated above. Pertinent History Reviewed:  Reviewed past medical,surgical, social, obstetrical and family history.  Reviewed problem list, medications and allergies. Physical Assessment:   Vitals:   12/06/23 1505  BP: 113/73  Pulse: 85  Weight: 187 lb 12.8 oz (85.2 kg)  Body mass index is 34.35 kg/m.        Physical Examination:   General appearance: Well appearing,  and in no distress  Mental status: Alert, oriented to person, place, and time  Skin: Warm & dry  Cardiovascular: Normal heart rate noted  Respiratory: Normal respiratory effort, no distress  Abdomen: Soft, gravid, nontender  Pelvic: thin prep pap obtained         Extremities:    Fetal Status: Fetal Heart Rate (bpm): 151 Fundal Height: 24 cm Movement: Present    Chaperone: Spanish Interpreter Results for orders placed or performed in visit on 12/06/23 (from the past 24 hours)  POCT Urinalysis Dipstick   Collection Time: 12/06/23  3:05 PM  Result Value Ref Range   Color, UA     Clarity, UA     Glucose, UA Negative Negative   Bilirubin, UA     Ketones, UA neg    Spec Grav, UA     Blood, UA neg    pH, UA     Protein, UA Negative Negative   Urobilinogen, UA     Nitrite, UA neg    Leukocytes, UA Negative Negative   Appearance     Odor      Assessment & Plan:  1) Low-risk pregnancy G1P0000 at [redacted]w[redacted]d with an Estimated Date of Delivery: 03/26/24   2) Cervical cancer screen, h/o abnormal pap, pap today  3) Scratchy throat> gave printed pregnancy safe meds   Meds:  No orders of the defined types were placed in this encounter.  Labs/procedures today: pap  Plan:  Continue routine obstetrical care  Next visit: prefers will be in person for pn2    Reviewed: Preterm labor symptoms and general obstetric precautions including but not limited to vaginal bleeding, contractions, leaking of fluid and fetal movement were reviewed in detail with the patient.  All questions were answered. Does have home bp cuff. Office bp cuff given: not applicable. Check bp weekly, let us  know if consistently >140 and/or >90.  Follow-up: Return in about 4 weeks (around 01/03/2024) for LROB, PN2, CNM, in person.  No future appointments.   Orders Placed This Encounter  Procedures   POCT Urinalysis Dipstick   Suzen JONELLE Fetters CNM, [facility] 12/06/2023 3:19 PM

## 2023-12-21 ENCOUNTER — Ambulatory Visit: Payer: Self-pay | Admitting: Women's Health

## 2023-12-21 DIAGNOSIS — Z8742 Personal history of other diseases of the female genital tract: Secondary | ICD-10-CM

## 2023-12-21 LAB — CYTOLOGY - PAP
Comment: NEGATIVE
Diagnosis: UNDETERMINED — AB
High risk HPV: POSITIVE — AB

## 2023-12-30 ENCOUNTER — Emergency Department (HOSPITAL_COMMUNITY)
Admission: EM | Admit: 2023-12-30 | Discharge: 2023-12-31 | Disposition: A | Discharge status: Home / Self Care | Attending: Emergency Medicine | Admitting: Emergency Medicine

## 2023-12-30 ENCOUNTER — Encounter (HOSPITAL_COMMUNITY): Payer: Self-pay

## 2023-12-30 ENCOUNTER — Other Ambulatory Visit: Payer: Self-pay

## 2023-12-30 DIAGNOSIS — Z3A27 27 weeks gestation of pregnancy: Secondary | ICD-10-CM | POA: Insufficient documentation

## 2023-12-30 DIAGNOSIS — Z7982 Long term (current) use of aspirin: Secondary | ICD-10-CM | POA: Diagnosis not present

## 2023-12-30 DIAGNOSIS — R1012 Left upper quadrant pain: Secondary | ICD-10-CM | POA: Insufficient documentation

## 2023-12-30 DIAGNOSIS — O26892 Other specified pregnancy related conditions, second trimester: Secondary | ICD-10-CM | POA: Insufficient documentation

## 2023-12-30 DIAGNOSIS — R1085 Abdominal pain of multiple sites: Secondary | ICD-10-CM

## 2023-12-30 LAB — URINALYSIS, ROUTINE W REFLEX MICROSCOPIC
Bacteria, UA: NONE SEEN
Bilirubin Urine: NEGATIVE
Glucose, UA: >=500 mg/dL — AB
Hgb urine dipstick: NEGATIVE
Ketones, ur: NEGATIVE mg/dL
Leukocytes,Ua: NEGATIVE
Nitrite: NEGATIVE
Protein, ur: NEGATIVE mg/dL
Specific Gravity, Urine: 1.015 (ref 1.005–1.030)
pH: 6 (ref 5.0–8.0)

## 2023-12-30 LAB — COMPREHENSIVE METABOLIC PANEL WITH GFR
ALT: 16 U/L (ref 0–44)
AST: 15 U/L (ref 15–41)
Albumin: 3.7 g/dL (ref 3.5–5.0)
Alkaline Phosphatase: 93 U/L (ref 38–126)
Anion gap: 14 (ref 5–15)
BUN: 6 mg/dL (ref 6–20)
CO2: 19 mmol/L — ABNORMAL LOW (ref 22–32)
Calcium: 8.8 mg/dL — ABNORMAL LOW (ref 8.9–10.3)
Chloride: 107 mmol/L (ref 98–111)
Creatinine, Ser: 0.55 mg/dL (ref 0.44–1.00)
GFR, Estimated: >60 mL/min (ref >60)
Glucose, Bld: 116 mg/dL — ABNORMAL HIGH (ref 70–99)
Potassium: 3.5 mmol/L (ref 3.5–5.1)
Sodium: 140 mmol/L (ref 135–145)
Total Bilirubin: 0.2 mg/dL (ref 0.0–1.2)
Total Protein: 6.2 g/dL — ABNORMAL LOW (ref 6.5–8.1)

## 2023-12-30 LAB — CBC WITH DIFFERENTIAL/PLATELET
Abs Immature Granulocytes: 0.16 K/uL — ABNORMAL HIGH (ref 0.00–0.07)
Basophils Absolute: 0 K/uL (ref 0.0–0.1)
Basophils Relative: 0 %
Eosinophils Absolute: 0.2 K/uL (ref 0.0–0.5)
Eosinophils Relative: 2 %
HCT: 38.2 % (ref 36.0–46.0)
Hemoglobin: 12.8 g/dL (ref 12.0–15.0)
Immature Granulocytes: 2 %
Lymphocytes Relative: 16 %
Lymphs Abs: 1.6 K/uL (ref 0.7–4.0)
MCH: 30.2 pg (ref 26.0–34.0)
MCHC: 33.5 g/dL (ref 30.0–36.0)
MCV: 90.1 fL (ref 80.0–100.0)
Monocytes Absolute: 0.7 K/uL (ref 0.1–1.0)
Monocytes Relative: 7 %
Neutro Abs: 7.5 K/uL (ref 1.7–7.7)
Neutrophils Relative %: 73 %
Platelets: 275 K/uL (ref 150–400)
RBC: 4.24 MIL/uL (ref 3.87–5.11)
RDW: 13.9 % (ref 11.5–15.5)
WBC: 10.2 K/uL (ref 4.0–10.5)
nRBC: 0 % (ref 0.0–0.2)

## 2023-12-30 LAB — LIPASE, BLOOD: Lipase: 31 U/L (ref 11–51)

## 2023-12-30 NOTE — ED Triage Notes (Signed)
 Pt reports she has not felt the baby move all day today and is having some pain in 2 areas of her abdomen.

## 2023-12-30 NOTE — ED Notes (Signed)
 Rec'd phone call by [facility] RN- cleared by OB according to attending Rollo Bring.  EDP notified.

## 2023-12-30 NOTE — ED Notes (Signed)
Went over d/c papers with patient and family. All questions answered. Ambulatory to lobby.  ?

## 2023-12-30 NOTE — ED Provider Notes (Cosign Needed Addendum)
 [place] EMERGENCY DEPARTMENT AT [facility] Provider Note   CSN: [id number] Arrival date & time: 12/30/23  2123     Patient presents with: no fetal movement   Heidi Dean is a 23 y.o. female G1P0000 currently [redacted] weeks pregnant with estimated delivery 03/26/2024 presenting for evaluation of abdominal pain and states she has not felt her baby move all day.  She endorses a hard pressure sensation in her suprapubic region and also in her left upper abdomen which she has not felt prior in her pregnancy.  She denies fevers or chills, nausea vomiting, chest pain or shortness of breath, also denies vaginal discharge or bleeding or spotting.  Denies dysuria or increased urinary frequency.  She does endorse being upset and tearful today as her maternal grandmother died yesterday who lives in [place]. Pt has had 2 meals today without worsening symptoms.  No surgical history.    The history is provided by the patient and the spouse.       Prior to Admission medications   Medication Sig Start Date End Date Taking? Authorizing Provider  albuterol  (VENTOLIN  HFA) 108 (90 Base) MCG/ACT inhaler Inhale 2 puffs into the lungs every 6 (six) hours as needed. Patient not taking: Reported on 10/11/2023 08/06/23   Leath-Warren, Etta PARAS, NP  aspirin  EC 81 MG tablet Take 1 tablet (81 mg total) by mouth daily. Swallow whole. 09/13/23   Kizzie Suzen SAUNDERS, CNM  Blood Pressure Monitor MISC For regular home bp monitoring during pregnancy Patient not taking: Reported on 11/08/2023 09/13/23   Kizzie Suzen SAUNDERS, CNM  Doxylamine -Pyridoxine  (DICLEGIS ) 10-10 MG TBEC 2 tabs q hs, if sx persist add 1 tab q am on day 3, if sx persist add 1 tab q afternoon on day 4 09/13/23   Kizzie Suzen SAUNDERS, CNM  fluticasone  (FLONASE ) 50 MCG/ACT nasal spray Place 2 sprays into both nostrils daily. Patient not taking: Reported on 11/08/2023 07/26/23   Leath-Warren, Etta PARAS, NP  metoCLOPramide  (REGLAN ) 10 MG tablet  Take 1 tablet (10 mg total) by mouth 4 (four) times daily. Patient not taking: Reported on 11/08/2023 10/11/23   Loreli Suzen D, CNM  prenatal vitamin w/FE, FA (PRENATAL 1 + 1) 27-1 MG TABS tablet Take 1 tablet by mouth daily at 12 noon. 06/13/23   Signa Delon LABOR, NP    Allergies: Patient has no known allergies.    Review of Systems  Constitutional:  Negative for chills and fever.  HENT:  Negative for congestion and sore throat.   Eyes: Negative.   Respiratory:  Negative for chest tightness and shortness of breath.   Cardiovascular:  Negative for chest pain.  Gastrointestinal:  Positive for abdominal pain. Negative for nausea and vomiting.  Genitourinary: Negative.  Negative for dysuria, vaginal bleeding and vaginal discharge.  Musculoskeletal:  Negative for arthralgias, joint swelling and neck pain.  Skin: Negative.  Negative for rash and wound.  Neurological:  Negative for dizziness, weakness, light-headedness, numbness and headaches.  Psychiatric/Behavioral: Negative.      Updated Vital Signs BP 107/67   Pulse 100   Temp 97.9 F (36.6 C) (Oral)   Resp 19   Wt 85.2 kg   LMP 05/28/2023 (Approximate)   SpO2 98%   BMI 34.35 kg/m   Physical Exam Vitals and nursing note reviewed.  Constitutional:      Appearance: She is well-developed.  HENT:     Head: Normocephalic and atraumatic.  Eyes:     Conjunctiva/sclera: Conjunctivae normal.  Cardiovascular:  Rate and Rhythm: Normal rate and regular rhythm.     Heart sounds: Normal heart sounds.  Pulmonary:     Effort: Pulmonary effort is normal.     Breath sounds: Normal breath sounds. No wheezing.  Abdominal:     General: Bowel sounds are normal.     Palpations: Abdomen is soft.     Tenderness: There is abdominal tenderness. There is no guarding.     Comments: Mild tenderness to palpation left upper abdomen and suprapubic region, there is no guarding.  Gravid abdomen.  Musculoskeletal:        General: Normal range of  motion.     Cervical back: Normal range of motion.  Skin:    General: Skin is warm and dry.  Neurological:     Mental Status: She is alert and oriented to person, place, and time.    On fetal heart monitor - fetal rate 142.  Being monitored by [facility] RN.   (all labs ordered are listed, but only abnormal results are displayed) Labs Reviewed  CBC WITH DIFFERENTIAL/PLATELET - Abnormal; Notable for the following components:      Result Value   Abs Immature Granulocytes 0.16 (*)    All other components within normal limits  COMPREHENSIVE METABOLIC PANEL WITH GFR - Abnormal; Notable for the following components:   CO2 19 (*)    Glucose, Bld 116 (*)    Calcium 8.8 (*)    Total Protein 6.2 (*)    All other components within normal limits  URINALYSIS, ROUTINE W REFLEX MICROSCOPIC - Abnormal; Notable for the following components:   APPearance CLOUDY (*)    Glucose, UA >=500 (*)    All other components within normal limits  LIPASE, BLOOD    EKG: None  Radiology: No results found.   Procedures   Medications Ordered in the ED - No data to display                                  Medical Decision Making [redacted] week gestation,  pt has not felt baby move all day today along with suprapubic and left upper abdomen pressure.  No vaginal bleeding,  dc, spotting.  Pt otherwise without complaint, no fever, n/v, diarrhea, dysuria.  Increased emotional stress with grandmothers death ytd.   Labs are reassuring, lipase, LFT's , wbc normal.  UA negative for infection,  greater than 500 glucose (pt states getting DM testing this week at ob appt).  Pending [facility] nurse clearance.  Anticipate dc home.  11:45 PM  Toco monitored complete - pt cleared by Dr. Abigail with ob.   Amount and/or Complexity of Data Reviewed Labs: ordered.    Details: Lipase, cbc, cmet, UA reviewed,  >500 glucose in urine,  pt to f/u with ob on 10/9 for routine f/u and gtt.  Glucose 116.         Final diagnoses:  Abdominal pain of multiple sites  [redacted] weeks gestation of pregnancy    ED Discharge Orders     None          Birdena Mliss RIGGERS 12/30/23 2337    Birdena Mliss, PA-C 12/30/23 2338    Birdena Mliss, PA-C 12/30/23 2349    Mesner, Selinda, MD 12/31/23 9498

## 2023-12-30 NOTE — ED Provider Notes (Incomplete)
 11:25 PM Assumed care from Dr. Dean and Julie Idol PA, please see their note for full history, physical and decision making until this point. In brief this is a 23 y.o. year old female who presented to the ED tonight with no fetal movement     Workup reassuring. Pending ob recommendations from [facility] monitoring.  Discharge instructions, including strict return precautions for new or worsening symptoms, given. Patient and/or family verbalized understanding and agreement with the plan as described.   Labs, studies and imaging reviewed by myself and considered in medical decision making if ordered. Imaging interpreted by radiology.  Labs Reviewed  CBC WITH DIFFERENTIAL/PLATELET - Abnormal; Notable for the following components:      Result Value   Abs Immature Granulocytes 0.16 (*)    All other components within normal limits  COMPREHENSIVE METABOLIC PANEL WITH GFR - Abnormal; Notable for the following components:   CO2 19 (*)    Glucose, Bld 116 (*)    Calcium 8.8 (*)    Total Protein 6.2 (*)    All other components within normal limits  URINALYSIS, ROUTINE W REFLEX MICROSCOPIC - Abnormal; Notable for the following components:   APPearance CLOUDY (*)    Glucose, UA >=500 (*)    All other components within normal limits  LIPASE, BLOOD    No orders to display    No follow-ups on file.

## 2023-12-30 NOTE — ED Notes (Signed)
 Spoke with [facility] Nurse and confirmed fetal heartbeat being monitored.

## 2023-12-31 NOTE — Progress Notes (Signed)
 Received call from [facility] regarding 27.4 week patient with complaint of decreased fetal movement today.  No LOF or VB, some mild occasional abdominal cramping.  Will remotely monitor.

## 2023-12-31 NOTE — Progress Notes (Signed)
 FHR 145, +accels no decelerations noted.  No uterine activity noted.

## 2023-12-31 NOTE — Progress Notes (Signed)
 Patient removed from [facility] fro discharge home

## 2023-12-31 NOTE — Progress Notes (Signed)
 Dr Abigail notified of patient complaints and FHR tracing.  FHR 145, +accels and no decelerations.  Fetal movement since noted per patient.  Advised to clear obstetrically.  [facility] RN advised of order and states will discuss with attending.

## 2024-01-04 ENCOUNTER — Ambulatory Visit: Admitting: Advanced Practice Midwife

## 2024-01-04 ENCOUNTER — Encounter: Payer: Self-pay | Admitting: Advanced Practice Midwife

## 2024-01-04 ENCOUNTER — Other Ambulatory Visit

## 2024-01-04 VITALS — BP 106/71 | HR 80 | Wt 194.0 lb

## 2024-01-04 DIAGNOSIS — Z3A28 28 weeks gestation of pregnancy: Secondary | ICD-10-CM | POA: Diagnosis not present

## 2024-01-04 DIAGNOSIS — Z3482 Encounter for supervision of other normal pregnancy, second trimester: Secondary | ICD-10-CM

## 2024-01-04 DIAGNOSIS — Z131 Encounter for screening for diabetes mellitus: Secondary | ICD-10-CM

## 2024-01-04 DIAGNOSIS — Z3403 Encounter for supervision of normal first pregnancy, third trimester: Secondary | ICD-10-CM | POA: Diagnosis not present

## 2024-01-04 DIAGNOSIS — Z3400 Encounter for supervision of normal first pregnancy, unspecified trimester: Secondary | ICD-10-CM

## 2024-01-04 NOTE — Progress Notes (Signed)
   LOW-RISK PREGNANCY VISIT Patient name: Heidi Dean MRN [id number]  Date of birth: 2000/10/10 Chief Complaint:   Routine Prenatal Visit (PN2)  History of Present Illness:   Heidi Dean is a 23 y.o. G81P0000 female at [redacted]w[redacted]d with an Estimated Date of Delivery: 03/26/24 being seen today for ongoing management of a low-risk pregnancy.  Today she reports some round ligament pain/pressure. Contractions: Not present. Vag. Bleeding: None.  Movement: Present. denies leaking of fluid. Review of Systems:   Pertinent items are noted in HPI Denies abnormal vaginal discharge w/ itching/odor/irritation, headaches, visual changes, shortness of breath, chest pain, abdominal pain, severe nausea/vomiting, or problems with urination or bowel movements unless otherwise stated above. Pertinent History Reviewed:  Reviewed past medical,surgical, social, obstetrical and family history.  Reviewed problem list, medications and allergies.    Physical Assessment:     Vitals:   01/04/24 0857  BP: 106/71  Pulse: 80  Weight: 194 lb (88 kg)  Body mass index is 35.48 kg/m.        Physical Examination:   General appearance: Well appearing, and in no distress  Mental status: Alert, oriented to person, place, and time  Skin: Warm & dry  Cardiovascular: Normal heart rate noted  Respiratory: Normal respiratory effort, no distress  Abdomen: Soft, gravid, nontender  Pelvic: Cervical exam deferred         Extremities: Edema: None Chaperone:  N/A   Fetal Status:     Movement: Present      No results found for this or any previous visit (from the past 24 hours).  Assessment & Plan:    Pregnancy: G1P0000 at [redacted]w[redacted]d 1. [redacted] weeks gestation of pregnancy   2. Supervision of normal first pregnancy, antepartum (Primary)      Meds: No orders of the defined types were placed in this encounter.  Labs/procedures today: PN2  Plan:  Continue routine obstetrical care  Next visit: prefers in  person    Reviewed: Preterm labor symptoms and general obstetric precautions including but not limited to vaginal bleeding, contractions, leaking of fluid and fetal movement were reviewed in detail with the patient.  All questions were answered. Has home bp cuff.. Check bp weekly, let us  know if >140/90.   Follow-up: Return in about 3 weeks (around 01/25/2024) for LROB.  No future appointments.  No orders of the defined types were placed in this encounter.  Cathlean Ely DNP, CNM 01/04/2024 9:44 AM

## 2024-01-04 NOTE — Patient Instructions (Signed)
 Marieann Zuluaga-Alquisiras, I greatly value your feedback.  If you receive a survey following your visit with us  today, we appreciate you taking the time to fill it out.  Thanks, Sherrell Ely, DNP, CNM  [facility] HAS MOVED!!! It is now [facility] at [facility] ([address] [place], [place] [address]) Entrance located off of [address] Free 24/7 valet parking   Go to [email or web address] to register for FREE online childbirth classes    Call the office [phone]) or go to [facility] if: You begin to have strong, frequent contractions Your water breaks.  Sometimes it is a big gush of fluid, sometimes it is just a trickle that keeps getting your panties wet or running down your legs You have vaginal bleeding.  It is normal to have a small amount of spotting if your cervix was checked.  You don't feel your baby moving like normal.  If you don't, get you something to eat and drink and lay down and focus on feeling your baby move.  You should feel at least 10 movements in 2 hours.  If you don't, you should call the office or go to [facility].   Home Blood Pressure Monitoring for Patients   Your provider has recommended that you check your blood pressure (BP) at least once a week at home. If you do not have a blood pressure cuff at home, one will be provided for you. Contact your provider if you have not received your monitor within 1 week.   Helpful Tips for Accurate Home Blood Pressure Checks  Don't smoke, exercise, or drink caffeine 30 minutes before checking your BP Use the restroom before checking your BP (a full bladder can raise your pressure) Relax in a comfortable upright chair Feet on the ground Left arm resting comfortably on a flat surface at the level of your heart Legs uncrossed Back supported Sit quietly and don't talk Place the cuff on your bare arm Adjust snuggly, so that only two fingertips can fit between your skin and  the top of the cuff Check 2 readings separated by at least one minute Keep a log of your BP readings For a visual, please reference this diagram: http://ccnc.care/bpdiagram  Provider Name: [facility]     Phone: [phone]  Zone 1: ALL CLEAR  Continue to monitor your symptoms:  BP reading is less than 140 (top number) or less than 90 (bottom number)  No right upper stomach pain No headaches or seeing spots No feeling nauseated or throwing up No swelling in face and hands  Zone 2: CAUTION Call your doctor's office for any of the following:  BP reading is greater than 140 (top number) or greater than 90 (bottom number)  Stomach pain under your ribs in the middle or right side Headaches or seeing spots Feeling nauseated or throwing up Swelling in face and hands  Zone 3: EMERGENCY  Seek immediate medical care if you have any of the following:  BP reading is greater than160 (top number) or greater than 110 (bottom number) Severe headaches not improving with Tylenol Serious difficulty catching your breath Any worsening symptoms from Zone 2

## 2024-01-05 LAB — CBC
Hematocrit: 41.1 % (ref 34.0–46.6)
Hemoglobin: 13.5 g/dL (ref 11.1–15.9)
MCH: 30.8 pg (ref 26.6–33.0)
MCHC: 32.8 g/dL (ref 31.5–35.7)
MCV: 94 fL (ref 79–97)
Platelets: 275 x10E3/uL (ref 150–450)
RBC: 4.38 x10E6/uL (ref 3.77–5.28)
RDW: 13.5 % (ref 11.7–15.4)
WBC: 10 x10E3/uL (ref 3.4–10.8)

## 2024-01-05 LAB — RPR: RPR Ser Ql: NONREACTIVE

## 2024-01-05 LAB — ANTIBODY SCREEN: Antibody Screen: NEGATIVE

## 2024-01-05 LAB — HIV ANTIBODY (ROUTINE TESTING W REFLEX): HIV Screen 4th Generation wRfx: NONREACTIVE

## 2024-01-05 LAB — GLUCOSE TOLERANCE, 2 HOURS W/ 1HR
Glucose, 1 hour: 117 mg/dL (ref 70–179)
Glucose, 2 hour: 101 mg/dL (ref 70–152)
Glucose, Fasting: 74 mg/dL (ref 70–91)

## 2024-01-25 ENCOUNTER — Encounter: Payer: Self-pay | Admitting: Women's Health

## 2024-01-25 ENCOUNTER — Ambulatory Visit (INDEPENDENT_AMBULATORY_CARE_PROVIDER_SITE_OTHER): Admitting: Women's Health

## 2024-01-25 VITALS — BP 105/71 | HR 88 | Wt 198.0 lb

## 2024-01-25 DIAGNOSIS — O368130 Decreased fetal movements, third trimester, not applicable or unspecified: Secondary | ICD-10-CM | POA: Diagnosis not present

## 2024-01-25 DIAGNOSIS — Z3403 Encounter for supervision of normal first pregnancy, third trimester: Secondary | ICD-10-CM

## 2024-01-25 DIAGNOSIS — Z23 Encounter for immunization: Secondary | ICD-10-CM

## 2024-01-25 DIAGNOSIS — Z3A31 31 weeks gestation of pregnancy: Secondary | ICD-10-CM

## 2024-01-25 DIAGNOSIS — Z3400 Encounter for supervision of normal first pregnancy, unspecified trimester: Secondary | ICD-10-CM

## 2024-01-25 NOTE — Progress Notes (Signed)
 LOW-RISK PREGNANCY VISIT Patient name: Heidi Dean MRN [id number]  Date of birth: 03-10-2001 Chief Complaint:   Routine Prenatal Visit and Non-stress Test (Decrease fetal movement)  History of Present Illness:   Heidi Dean is a 23 y.o. G29P0000 female at [redacted]w[redacted]d with an Estimated Date of Delivery: 03/26/24 being seen today for ongoing management of a low-risk pregnancy.   Today she reports decreased fetal movement since last night, fine now that on EFM. Contractions: Not present.  .  Movement: (!) Decreased. denies leaking of fluid.     01/04/2024    9:05 AM 09/13/2023    2:39 PM 11/23/2022    8:48 AM  Depression screen PHQ 2/9  Decreased Interest 0 1 0  Down, Depressed, Hopeless 0 0 0  PHQ - 2 Score 0 1 0  Altered sleeping 0 0 1  Tired, decreased energy 1 2 1   Change in appetite 0 1 0  Feeling bad or failure about yourself  0 0 0  Trouble concentrating 0 0 0  Moving slowly or fidgety/restless 0 0 0  Suicidal thoughts 0 0 0  PHQ-9 Score 1 4 2         01/04/2024    9:06 AM 09/13/2023    2:40 PM 11/23/2022    8:49 AM  GAD 7 : Generalized Anxiety Score  Nervous, Anxious, on Edge 0 0 0  Control/stop worrying 0 0 0  Worry too much - different things 0 0 1  Trouble relaxing 0 0 0  Restless 0 0 0  Easily annoyed or irritable 0 0 0  Afraid - awful might happen 0 0 0  Total GAD 7 Score 0 0 1      Review of Systems:   Pertinent items are noted in HPI Denies abnormal vaginal discharge w/ itching/odor/irritation, headaches, visual changes, shortness of breath, chest pain, abdominal pain, severe nausea/vomiting, or problems with urination or bowel movements unless otherwise stated above. Pertinent History Reviewed:  Reviewed past medical,surgical, social, obstetrical and family history.  Reviewed problem list, medications and allergies. Physical Assessment:   Vitals:   01/25/24 1457  BP: 105/71  Pulse: 88  Weight: 198 lb (89.8 kg)  Body mass index is  36.21 kg/m.        Physical Examination:   General appearance: Well appearing, and in no distress  Mental status: Alert, oriented to person, place, and time  Skin: Warm & dry  Cardiovascular: Normal heart rate noted  Respiratory: Normal respiratory effort, no distress  Abdomen: Soft, gravid, nontender  Pelvic: Cervical exam deferred         Extremities: Edema: None  Fetal Status: Fetal Heart Rate (bpm): 135 Fundal Height: 31 cm Movement: (!) Decreased   NST: FHR baseline 135 bpm, Variability: moderate, Accelerations:present, Decelerations:  Absent= Cat 1/reactive Toco: none   Chaperone: N/A No results found for this or any previous visit (from the past 24 hours).  Assessment & Plan:  1) Low-risk pregnancy G1P0000 at [redacted]w[redacted]d with an Estimated Date of Delivery: 03/26/24   2) DFM, reactive NST, reviewed [facility]/reasons to seek care   Meds: No orders of the defined types were placed in this encounter.  Labs/procedures today: flu shot, Tdap, and NST  Plan:  Continue routine obstetrical care  Next visit: prefers in person    Reviewed: Preterm labor symptoms and general obstetric precautions including but not limited to vaginal bleeding, contractions, leaking of fluid and fetal movement were reviewed in detail with the patient.  All questions were  answered. Does have home bp cuff. Office bp cuff given: not applicable. Check bp weekly, let us  know if consistently >140 and/or >90.  Follow-up: Return in about 2 weeks (around 02/08/2024) for LROB, CNM, in person.  Future Appointments  Date Time Provider Department Center  02/08/2024  3:50 PM Cresenzo-Dishmon, Cathlean, CNM [facility]    Orders Placed This Encounter  Procedures   Tdap vaccine greater than or equal to 7yo IM   Flu vaccine trivalent PF, 6mos and older(Flulaval,Afluria,Fluarix,Fluzone)   Suzen JONELLE Fetters CNM, [facility] 01/25/2024 3:51 PM

## 2024-01-25 NOTE — Patient Instructions (Signed)
 Heidi Dean, thank you for choosing our office today! We appreciate the opportunity to meet your healthcare needs. You may receive a short survey by mail, e-mail, or through [employer]. If you are happy with your care we would appreciate if you could take just a few minutes to complete the survey questions. We read all of your comments and take your feedback very seriously. Thank you again for choosing our office.  Center for [employer] Team at [facility]  [facility] at [facility] ([address] [place], [place] [address]) Entrance C, located off of [address] Free 24/7 valet parking   CLASSES: Go to [email or web address] to register for classes (childbirth, breastfeeding, waterbirth, infant CPR, daddy bootcamp, etc.)  Call the office [phone]) or go to [facility] if: You begin to have strong, frequent contractions Your water breaks.  Sometimes it is a big gush of fluid, sometimes it is just a trickle that keeps getting your panties wet or running down your legs You have vaginal bleeding.  It is normal to have a small amount of spotting if your cervix was checked.  You don't feel your baby moving like normal.  If you don't, get you something to eat and drink and lay down and focus on feeling your baby move.   If your baby is still not moving like normal, you should call the office or go to [facility].  Call the office [phone]) or go to [facility] hospital for these signs of pre-eclampsia: Severe headache that does not go away with Tylenol Visual changes- seeing spots, double, blurred vision Pain under your right breast or upper abdomen that does not go away with Tums or heartburn medicine Nausea and/or vomiting Severe swelling in your hands, feet, and face   Tdap Vaccine It is recommended that you get the Tdap vaccine during the third trimester of EACH pregnancy to help protect your baby from getting pertussis (whooping cough) 27-36 weeks is the BEST time to do  this so that you can pass the protection on to your baby. During pregnancy is better than after pregnancy, but if you are unable to get it during pregnancy it will be offered at the hospital.  You can get this vaccine with us , at the health department, your family doctor, or some local pharmacies Everyone who will be around your baby should also be up-to-date on their vaccines before the baby comes. Adults (who are not pregnant) only need 1 dose of Tdap during adulthood.   [facility] Pediatricians/Family Doctors [facility]): [address] Luba BROCKS, [phone]           [facility]: [address], [phone]                [facility]): [address], [phone] (call to ask if accepting patients) [facility] Department: [address], [place], [phone]    [facility] Pediatricians/Family Doctors [facility]): [phone], [phone] [facility]: [address] [place], [phone] [facility]: [address], [phone]  [facility] Doctors  [facility]): [phone] [facility]: [address], [phone]   [facility] Doctors [facility]: [address] [phone]  [facility] Family Doctors  [facility]: [phone], [phone]  Home Blood Pressure Monitoring for Patients   Your provider has recommended that you check your  blood pressure (BP) at least once a week at home. If you do not have a blood pressure cuff at home, one will be provided for you. Contact your provider if you have not received your monitor within 1 week.   Helpful Tips for Accurate Home Blood Pressure Checks  Don't smoke, exercise, or drink caffeine 30 minutes before checking your BP Use the restroom before checking your BP (a full bladder can raise your  pressure) Relax in a comfortable upright chair Feet on the ground Left arm resting comfortably on a flat surface at the level of your heart Legs uncrossed Back supported Sit quietly and don't talk Place the cuff on your bare arm Adjust snuggly, so that only two fingertips can fit between your skin and the top of the cuff Check 2 readings separated by at least one minute Keep a log of your BP readings For a visual, please reference this diagram: http://ccnc.care/bpdiagram  Provider Name: [facility]     Phone: [phone]  Zone 1: ALL CLEAR  Continue to monitor your symptoms:  BP reading is less than 140 (top number) or less than 90 (bottom number)  No right upper stomach pain No headaches or seeing spots No feeling nauseated or throwing up No swelling in face and hands  Zone 2: CAUTION Call your doctor's office for any of the following:  BP reading is greater than 140 (top number) or greater than 90 (bottom number)  Stomach pain under your ribs in the middle or right side Headaches or seeing spots Feeling nauseated or throwing up Swelling in face and hands  Zone 3: EMERGENCY  Seek immediate medical care if you have any of the following:  BP reading is greater than160 (top number) or greater than 110 (bottom number) Severe headaches not improving with Tylenol Serious difficulty catching your breath Any worsening symptoms from Zone 2  Tercer trimestre de embarazo Third Trimester of Pregnancy  El tercer trimestre de embarazo va desde la semana 28 hasta la semana 40. Esto corresponde a los meses 7 a 9. El tercer trimestre es un perodo en el que el beb crece rpido. Cambios en el cuerpo durante el tercer trimestre Su cuerpo contina cambiando durante este perodo. En general, los cambios desaparecen despus del nacimiento del beb. Cambios fsicos Usted seguir aumentando de [place]. Podrn aparecer estras en las caderas, la barriga y las mamas. Las conagra foods seguirn  creciendo y pueden dolerle. Un lquido amarillo (calostro) puede salir de sus pechos. Esta es la primera leche que usted produce para el beb. El cabello puede crecerle ms rpido y engrosarse. En algunos casos, puede haber cada del cabello. El ombligo puede salir hacia afuera. Puede observar que se le hinchan ms las [address], la cara o los tobillos. Cambios en la salud Es posible que tenga acidez estomacal. Es posible que sienta que le falta el aire. La causa de esto es que el tero ahora es ms grande. Puede presentar ms dolor en la pelvis, la espalda o los muslos. Puede presentar ms hormigueo o entumecimiento en las manos, los brazos y las piernas. Es posible que orine con mayor frecuencia. Puede tener dificultad para defecar (estreimiento) o venas hinchadas en el ano que pueden picar o doler (hemorroides). Otros cambios Puede tener ms problemas para dormir. Puede notar que el beb se mueve ms hacia bajo adentro de la barriga [place]"). Puede ser que le salga ms lquido de la vagina. Puede sentir las articulaciones flojas y production assistant, radio alrededor del  hueso plvico. Siga estas instrucciones en su casa: Medicamentos Use los medicamentos solamente como se lo haya indicado el mdico. Algunos medicamentos no son seguros durante el embarazo. El mdico puede cambiar los medicamentos que usa . No use ningn medicamento a menos que se lo haya indicado el mdico. Tome vitaminas prenatales que tengan por lo menos 600 microgramos (mcg) de cido flico. No consuma medicamentos a base de hierbas, drogas ilegales, ni medicamentos que el mdico no haya autorizado. Comida y bebida Durante el embarazo, [place] cuerpo necesita nutricin adicional para brindar sustento al beb que est creciendo. Hable con su mdico sobre sus necesidades nutricionales. Actividad La mayora de las mujeres pueden hacer ejercicio regularmente durante el embarazo. Las rutinas de ejercicio pueden tener que cambiar en la  etapa final del [place]. Hable con su mdico sobre sus actividades y rutina de ejercicio. Alivio del dolor y del malestar Descanse a menudo y con las piernas elevadas si tiene educational psychologist en las piernas o dolor de patent attorney. Tome baos de asiento tibios para engineer, materials de las hemorroides. Use una crema para las hemorroides si el mdico se lo permite. Use un sostn que le brinde buen soporte si le duelen las mamas. No se d baos de inmersin en agua caliente, baos turcos ni saunas. No se haga duchas vaginales. No use tampones ni protectores de ropa interior perfumados. Seguridad Hable con su mdico antes de viajar distancias largas. Use el cinturn de seguridad en todo momento mientras vaya en auto. Hable con su mdico si alguien la golpea, la lastima o le grita. Preparacin para el nacimiento Preparacin para recibir al beb: Tome clases para prepararse para el parto y patent examiner. Visite el hospital y recorra el rea de maternidad. Compre un asiento de seguridad trw automotive atrs para llevar al beb en el automvil. Aprenda cmo instalarlo en el auto. Instrucciones generales Evite el contacto con las bandejas sanitarias de los gatos y la tierra que estos animales usan. Estos objetos tienen grmenes que puedenperjudicar el embarazo y el beb. No beba alcohol, no fume, no vapee ni consuma productos que tengan nicotina o tabaco. Si necesita ayuda para dejar de fumar, hable con su mdico. Asista a todas las visitas de seguimiento del tercer trimestre. El mdico le har ms exmenes y estudios durante este trimestre. Escriba sus preguntas. Llvelas cuando concurra a las visitas prenatales. El mdico tambin: Hablar con usted sobre su salud general. Ladora dar consejos o la derivar a especialistas que puedan ayudarla con diferentes necesidades, entre ellas: Salud mental y psicoterapia. Alimentos y alimentacin saludable. Si necesita ayuda con alimentos, pdala. Dnde buscar ms  informacin American Pregnancy Association (Asociacin Americana del Embarazo): americanpregnancy.org [employer] of Obstetricians and Gynecologists (Colegio Estadounidense de Obstetras y Scientific Laboratory Technician): acog.org Office on [employer] (Oficina para la Salud de la Mujer): travellesson.ca Comunquese con un mdico si: Tiene un dolor de cabeza que no desaparece despus de science writer. Tiene alguno de estos problemas: No puede comer ni beber. Tiene nuseas y vmitos. Hace deposiciones acuosas (diarrea) durante 2 o ms das. Siente dolor al orinar o hace orina con mal olor. Se ha sentido enferma durante 2 o ms das y no mejora. Comunquese con su mdico de inmediato si: Alguna de estas sustancias emana de la vagina: Secrecin anormal. Lquido con mal olor. Sangrado. El beb se mueve menos de lo habitual. Presenta signos de [place] de parto: Tiene contracciones, clicos en la barriga o dolor en la pelvis o la parte baja de la espalda antes  de las [address] circle de embarazo (trabajo de parto prematuro). Tiene contracciones regulares separadas por menos de 5 minutos. Rompe la bolsa. Tiene sntomas de presin arterial alta o preeclampsia. Esto incluye lo siguiente: Dolor de cabeza intenso y punzante que no desaparece. Hinchazn sbita o extrema del rostro, las manos, las piernas o los pies. Problemas de visin: Ve manchas. Tiene la visin borrosa. Los ojos tienen sensibilidad a statistician. Si no puede comunicarse con su mdico, acuda a una sala de atencin de urgencias o emergencias. Solicite ayuda de inmediato si: Se desmaya, se siente confundida o no puede pensar con claridad. Tiene dolor en el pecho o dificultad para respirar. Sufre cualquier tipo de lesin, por ejemplo, debido a una cada o un accidente automovilstico. Estos sntomas pueden indicar una emergencia. Llame al 911 de inmediato. No espere a ver si los sntomas desaparecen. No conduzca por sus propios medios [employer]. Esta informacin no tiene theme park manager el consejo del mdico. Asegrese de hacerle al mdico cualquier pregunta que tenga. Document Revised: 08/18/2022 Document Reviewed: 08/18/2022 Elsevier Patient Education  2024 [employer].

## 2024-02-08 ENCOUNTER — Ambulatory Visit: Admitting: Advanced Practice Midwife

## 2024-02-08 ENCOUNTER — Encounter: Payer: Self-pay | Admitting: Advanced Practice Midwife

## 2024-02-08 VITALS — BP 106/69 | HR 83 | Wt 200.0 lb

## 2024-02-08 DIAGNOSIS — Z3403 Encounter for supervision of normal first pregnancy, third trimester: Secondary | ICD-10-CM | POA: Diagnosis not present

## 2024-02-08 DIAGNOSIS — Z3A33 33 weeks gestation of pregnancy: Secondary | ICD-10-CM

## 2024-02-08 DIAGNOSIS — Z3400 Encounter for supervision of normal first pregnancy, unspecified trimester: Secondary | ICD-10-CM

## 2024-02-08 NOTE — Progress Notes (Signed)
   LOW-RISK PREGNANCY VISIT Patient name: Heidi Dean MRN [id number]  Date of birth: [birth year] Chief Complaint:   Routine Prenatal Visit  History of Present Illness:   Heidi Dean is a 23 y.o. G70P0000 female at [redacted]w[redacted]d with an Estimated Date of Delivery: 03/26/24 being seen today for ongoing management of a low-risk pregnancy.  Today she reports no complaints. Contractions: Irritability. Vag. Bleeding: None.  Movement: Present. denies leaking of fluid. Review of Systems:   Pertinent items are noted in HPI Denies abnormal vaginal discharge w/ itching/odor/irritation, headaches, visual changes, shortness of breath, chest pain, abdominal pain, severe nausea/vomiting, or problems with urination or bowel movements unless otherwise stated above. Pertinent History Reviewed:  Reviewed past medical,surgical, social, obstetrical and family history.  Reviewed problem list, medications and allergies.    Physical Assessment:     Vitals:   02/08/24 1546  BP: 106/69  Pulse: 83  Weight: 200 lb (90.7 kg)  Body mass index is 36.58 kg/m.        Physical Examination:   General appearance: Well appearing, and in no distress  Mental status: Alert, oriented to person, place, and time  Skin: Warm & dry  Cardiovascular: Normal heart rate noted  Respiratory: Normal respiratory effort, no distress  Abdomen: Soft, gravid, nontender  Pelvic: Cervical exam deferred         Extremities:   Chaperone:  N/A   Fetal Status:     Movement: Present      No results found for this or any previous visit (from the past 24 hours).  Assessment & Plan:    Pregnancy: G1P0000 at [redacted]w[redacted]d 1. [redacted] weeks gestation of pregnancy (Primary)   2. Supervision of normal first pregnancy, antepartum      Meds: No orders of the defined types were placed in this encounter.  Labs/procedures today: none  Plan:  Continue routine obstetrical care  Next visit: prefers in person    Reviewed: Preterm  labor symptoms and general obstetric precautions including but not limited to vaginal bleeding, contractions, leaking of fluid and fetal movement were reviewed in detail with the patient.  All questions were answered. Has home bp cuff.. Check bp weekly, let us  know if >140/90.   Follow-up: No follow-ups on file.  No future appointments.  No orders of the defined types were placed in this encounter.  Cathlean Ely DNP, CNM 02/08/2024 4:30 PM

## 2024-02-26 ENCOUNTER — Encounter: Payer: Self-pay | Admitting: Obstetrics & Gynecology

## 2024-02-26 ENCOUNTER — Other Ambulatory Visit (HOSPITAL_COMMUNITY)
Admission: RE | Admit: 2024-02-26 | Discharge: 2024-02-26 | Disposition: A | Source: Ambulatory Visit | Attending: Obstetrics & Gynecology | Admitting: Obstetrics & Gynecology

## 2024-02-26 ENCOUNTER — Ambulatory Visit (INDEPENDENT_AMBULATORY_CARE_PROVIDER_SITE_OTHER): Payer: PRIVATE HEALTH INSURANCE | Admitting: Obstetrics & Gynecology

## 2024-02-26 VITALS — BP 128/81 | HR 90 | Wt 203.8 lb

## 2024-02-26 DIAGNOSIS — Z3A35 35 weeks gestation of pregnancy: Secondary | ICD-10-CM | POA: Insufficient documentation

## 2024-02-26 DIAGNOSIS — Z3403 Encounter for supervision of normal first pregnancy, third trimester: Secondary | ICD-10-CM

## 2024-02-26 DIAGNOSIS — Z9229 Personal history of other drug therapy: Secondary | ICD-10-CM

## 2024-02-26 DIAGNOSIS — Z2911 Encounter for prophylactic immunotherapy for respiratory syncytial virus (RSV): Secondary | ICD-10-CM | POA: Diagnosis not present

## 2024-02-26 DIAGNOSIS — Z3400 Encounter for supervision of normal first pregnancy, unspecified trimester: Secondary | ICD-10-CM

## 2024-02-26 NOTE — Progress Notes (Signed)
   LOW-RISK PREGNANCY VISIT Patient name: Heidi Dean MRN [id number]  Date of birth: 2001-02-13 Chief Complaint:   Routine Prenatal Visit  History of Present Illness:   Heidi Dean is a 23 y.o. G22P0000 female at [redacted]w[redacted]d with an Estimated Date of Delivery: 03/26/24 being seen today for ongoing management of a low-risk pregnancy.   Spanish interpreter present     01/04/2024    9:05 AM 09/13/2023    2:39 PM 11/23/2022    8:48 AM  Depression screen PHQ 2/9  Decreased Interest 0 1 0  Down, Depressed, Hopeless 0 0 0  PHQ - 2 Score 0 1 0  Altered sleeping 0 0 1  Tired, decreased energy 1 2 1   Change in appetite 0 1 0  Feeling bad or failure about yourself  0 0 0  Trouble concentrating 0 0 0  Moving slowly or fidgety/restless 0 0 0  Suicidal thoughts 0 0 0  PHQ-9 Score 1  4  2       Data saved with a previous flowsheet row definition    Today she reports no complaints. Contractions: Not present. Vag. Bleeding: None.  Movement: Present. denies leaking of fluid. Review of Systems:   Pertinent items are noted in HPI Denies abnormal vaginal discharge w/ itching/odor/irritation, headaches, visual changes, shortness of breath, chest pain, abdominal pain, severe nausea/vomiting, or problems with urination or bowel movements unless otherwise stated above. Pertinent History Reviewed:  Reviewed past medical,surgical, social, obstetrical and family history.  Reviewed problem list, medications and allergies.  Physical Assessment:   Vitals:   02/26/24 1518  BP: 128/81  Pulse: 90  Weight: 203 lb 12.8 oz (92.4 kg)  Body mass index is 37.28 kg/m.        Physical Examination:   General appearance: Well appearing, and in no distress  Mental status: Alert, oriented to person, place, and time  Skin: Warm & dry  Respiratory: Normal respiratory effort, no distress  Abdomen: Soft, gravid, nontender  Pelvic: Cervical exam performed  Dilation: Closed Effacement (%): Thick  Station: -3  Extremities:  1+ edema  Psych:  mood and affect appropriate  Fetal Status: Fetal Heart Rate (bpm): 135 Fundal Height: 37 cm Movement: Present    Chaperone: Alan Fischer    No results found for this or any previous visit (from the past 24 hours).   Assessment & Plan:  1) Low-risk pregnancy G1P0000 at [redacted]w[redacted]d with an Estimated Date of Delivery: 03/26/24     Meds: No orders of the defined types were placed in this encounter.  Labs/procedures today: RSV vaccine, GBS, GC/C  Plan:  Continue routine obstetrical care  Next visit: prefers in person    Reviewed: Preterm labor symptoms and general obstetric precautions including but not limited to vaginal bleeding, contractions, leaking of fluid and fetal movement were reviewed in detail with the patient.  All questions were answered.  Follow-up: Return in about 1 week (around 03/04/2024) for LROB visit.  Orders Placed This Encounter  Procedures   Culture, beta strep (group b only)    Audre Cenci, DO Attending Obstetrician & Gynecologist, [facility] [employer], [facility]

## 2024-02-28 ENCOUNTER — Ambulatory Visit: Payer: Self-pay | Admitting: Obstetrics & Gynecology

## 2024-02-28 LAB — CERVICOVAGINAL ANCILLARY ONLY
Chlamydia: NEGATIVE
Comment: NEGATIVE
Comment: NORMAL
Neisseria Gonorrhea: NEGATIVE

## 2024-03-01 LAB — CULTURE, BETA STREP (GROUP B ONLY): Strep Gp B Culture: POSITIVE — AB

## 2024-03-04 ENCOUNTER — Encounter: Admitting: Advanced Practice Midwife

## 2024-03-14 ENCOUNTER — Encounter: Payer: Self-pay | Admitting: Advanced Practice Midwife

## 2024-03-14 ENCOUNTER — Ambulatory Visit: Admitting: Advanced Practice Midwife

## 2024-03-14 VITALS — BP 118/79 | HR 75 | Wt 212.0 lb

## 2024-03-14 DIAGNOSIS — Z3A38 38 weeks gestation of pregnancy: Secondary | ICD-10-CM

## 2024-03-14 DIAGNOSIS — Z3403 Encounter for supervision of normal first pregnancy, third trimester: Secondary | ICD-10-CM | POA: Diagnosis not present

## 2024-03-14 DIAGNOSIS — Z3400 Encounter for supervision of normal first pregnancy, unspecified trimester: Secondary | ICD-10-CM

## 2024-03-14 NOTE — Progress Notes (Signed)
° °  LOW-RISK PREGNANCY VISIT Patient name: Heidi Dean MRN [id number]  Date of birth: [birth year] Chief Complaint:   Routine Prenatal Visit  History of Present Illness:   Zakiyah Zuluaga-Alquisiras is a 23 y.o. G84P0000 female at [redacted]w[redacted]d with an Estimated Date of Delivery: 03/26/24 being seen today for ongoing management of a low-risk pregnancy.  Today she reports normal pregnancy complaints. Contractions: Irritability. Vag. Bleeding: None.  Movement: Present. denies leaking of fluid. Review of Systems:   Pertinent items are noted in HPI Denies abnormal vaginal discharge w/ itching/odor/irritation, headaches, visual changes, shortness of breath, chest pain, abdominal pain, severe nausea/vomiting, or problems with urination or bowel movements unless otherwise stated above. Pertinent History Reviewed:  Reviewed past medical,surgical, social, obstetrical and family history.  Reviewed problem list, medications and allergies.    Physical Assessment:     Vitals:   03/14/24 0913  BP: 118/79  Pulse: 75  Weight: 212 lb (96.2 kg)  Body mass index is 38.78 kg/m.        Physical Examination:   General appearance: Well appearing, and in no distress  Mental status: Alert, oriented to person, place, and time  Skin: Warm & dry  Cardiovascular: Normal heart rate noted  Respiratory: Normal respiratory effort, no distress  Abdomen: Soft, gravid, nontender  Pelvic: Cervical exam performed  Dilation: 2 Effacement (%): 50 Station: -2  Extremities: Edema: Moderate pitting, indentation subsides rapidly Chaperone:  interpreter   Fetal Status: Fetal Heart Rate (bpm): 138 Fundal Height: 38 cm Movement: Present Presentation: Vertex    No results found for this or any previous visit (from the past 24 hours).  Assessment & Plan:    Pregnancy: G1P0000 at [redacted]w[redacted]d 1. [redacted] weeks gestation of pregnancy (Primary)   2. Supervision of normal first pregnancy, antepartum      Meds: No orders of the  defined types were placed in this encounter.  Labs/procedures today: none  Plan:  Continue routine obstetrical care  Next visit: prefers in person    Reviewed: Term labor symptoms and general obstetric precautions including but not limited to vaginal bleeding, contractions, leaking of fluid and fetal movement were reviewed in detail with the patient.  All questions were answered. Has home bp cuff. Rx faxed to . Check bp weekly, let us  know if >140/90.   Follow-up: Return for weekly LROB X2,w/NST added to the visit in 2 weeks.  Future Appointments  Date Time Provider Department Center  03/19/2024 10:30 AM [facility] NURSE [facility]  03/27/2024  1:30 PM [facility] NURSE [facility]  03/27/2024  1:50 PM Kizzie Suzen SAUNDERS, CNM [facility]    No orders of the defined types were placed in this encounter.  Cathlean Ely DNP, CNM 03/14/2024 10:11 AM

## 2024-03-14 NOTE — Patient Instructions (Signed)
 Estoy en labor de parto?   Que es labor de parto?  El [place] de parto(labor) es el trabajo de su cuerpo para dar a luz a su bebe. Su matriz se contrae. El cuello uterino se abre y usted pujara su bebe ha  cia el mundo.  Como se sienten las contracciones? (dolores del parto)  Al principio de labor de parto las contracciones generalmente se sienten como el colico de las reglas mensuales. Algunas veces puede haber dolor en la espalda. Mas comunmente, las contracciones se sienten como dolores en la parte baja de la barriga que [place] y vienen con un ritmo regular. Al comienzo del labor de parto pueden que pasen de 15 a 20 minutos entre contracciones y que no se sientan muy dolorosas. Mediante el parto progresa, las contracciones seran m  as y mas fuertesyfrecuentes, y mas dolorosas. Como cuento la frecuencia de mis contracciones?  La frecuencia de las contracciones es el numero de minutos que pasan desde el principio de una contraccion hasta el principio de la siguiente contraccion . Que debo hacer cuando las contracciones empiezan?  Si es de noche y puede dormir, Facilities manager. Si sus contracciones de parto comienzan durante el d?a o si no puede dormir, aqu? hay varias cosas que puede hacer mientras estas en casa:  Caminar. Si los dolores que siente son el comienzo del labor de parto, [place] caminar causara que las contracciones vengan mas frecuentemente y que se vuelvan mas fuertes. De otras palabras, esto ayudara a  que progrese tu labor. Sin embardo, si al caminar las contracciones disminuyen en frecuencia o intensidad, es probable que las contracciones no son contracciones de [place].  Tome  se una ducha o un bano~ . Esto ayudara a que se relaje.  Coma. El [place] del parto es un evento grande. Su cuerpo necesita mucha energ?a.  [employer] Si las contracciones no son de parto (parto falso), el tomar agua ayudara a  disminuirlas. Si las contracciones son de [place] real, el tomar agua  ayudara a que usted se mantenga fuerte durante el [place].  [place] una siesta. Obtenga todo el descanso que pueda.  Consiga que alguien le de un [place]. Si sus dolores de parto son en su espalda, un fuerte masaje en la espalda le ayudara a Engineer, materials. Un masaje de pies tambien puede ser bueno durante el labor de [place].  No se asuste/relajese. Usted puede parir. Recuerde que usted es fuerte y que su cuerpo esta he  cho para esto. Cuando debo ir al hospital o llamar a mi proveedor de atenci  on?   Si sus contracciones han tenido una frecuencia de cada 5 minutos o mas o menos por una hora o mas.  Si sus contracciones son tan dolorosas que no puede caminar o Sport and exercise psychologist.  Si su fuente de agua se rompe. (Esto puede ser un gran chorro de agua o simplemente un poco de agua que escurre por sus piernas al caminar.) Hay otras razones por las cuales debo llamar mi proveedor de atencion o ir al hospital?  S?, debe llamar a su proveedor de Archivist o ir a  l hospital si:  Tiene sangrado como una regla/periodo menstrual o mas (si tiene que usar una toalla femenina para evitar que se mojen sus ropas?ntimas).

## 2024-03-19 ENCOUNTER — Ambulatory Visit: Admitting: Obstetrics and Gynecology

## 2024-03-19 ENCOUNTER — Other Ambulatory Visit

## 2024-03-19 VITALS — BP 127/87 | HR 77 | Wt 211.0 lb

## 2024-03-19 DIAGNOSIS — O99820 Streptococcus B carrier state complicating pregnancy: Secondary | ICD-10-CM

## 2024-03-19 DIAGNOSIS — Z3400 Encounter for supervision of normal first pregnancy, unspecified trimester: Secondary | ICD-10-CM

## 2024-03-19 DIAGNOSIS — Z3A39 39 weeks gestation of pregnancy: Secondary | ICD-10-CM | POA: Diagnosis not present

## 2024-03-19 NOTE — Progress Notes (Signed)
" ° °  PRENATAL VISIT NOTE  Subjective:  Heidi Dean is a 23 y.o. G1P0000 at [redacted]w[redacted]d being seen today for ongoing prenatal care.  She is currently monitored for the following issues for this low-risk pregnancy and has Supervision of normal first pregnancy, antepartum and History of abnormal cervical Pap smear on their problem list.  Patient reports irregular cramping .  Contractions: Not present. Vag. Bleeding: None.  Movement: Present. Denies leaking of fluid.   The following portions of the patient's history were reviewed and updated as appropriate: allergies, current medications, past family history, past medical history, past social history, past surgical history and problem list.   Objective:   Vitals:   03/19/24 1046  BP: 127/87  Pulse: 77  Weight: 211 lb (95.7 kg)    Fetal Status:  Fetal Heart Rate (bpm): 140s Fundal Height: 39 cm Movement: Present Presentation: Vertex  General: Alert, oriented and cooperative. Patient is in no acute distress.  Skin: Skin is warm and dry. No rash noted.   Cardiovascular: Normal heart rate noted  Respiratory: Normal respiratory effort, no problems with respiration noted  Abdomen: Soft, gravid, appropriate for gestational age.  Pain/Pressure: Present     Pelvic: Cervical exam performed in the presence of a chaperone Dilation: 2 Effacement (%): 50 Station: -3  Extremities: Normal range of motion.     Mental Status: Normal mood and affect. Normal behavior. Normal judgment and thought content.   Fetal surveillance: NST FHR 140s, Variability: moderate, Accelerations: present, Decelerations: absent, Reactive  Assessment and Plan:  Pregnancy: G1P0000 at [redacted]w[redacted]d 1. Supervision of normal first pregnancy, antepartum (Primary) BP and FHR normal Doing well, feeling regular movement    2. [redacted] weeks gestation of pregnancy Discussed membrane sweep, desires next visit  Labor precautions given  Discussed IOL at 41 weeks if not delivered prior,  scheduled today  3. Group B Streptococcus carrier, +RV culture, currently pregnant Tx in labor   Term labor symptoms and general obstetric precautions including but not limited to vaginal bleeding, contractions, leaking of fluid and fetal movement were reviewed in detail with the patient. Please refer to After Visit Summary for other counseling recommendations.     Future Appointments  Date Time Provider Department Center  03/27/2024  1:30 PM [facility] NURSE [facility]  03/27/2024  1:50 PM Kizzie Suzen SAUNDERS, CNM [facility]  04/04/2024  6:30 AM [facility] SCHED ROOM [facility] None    Nidia Daring, FNP "

## 2024-03-25 ENCOUNTER — Inpatient Hospital Stay (HOSPITAL_COMMUNITY)
Admission: AD | Admit: 2024-03-25 | Discharge: 2024-03-29 | DRG: 768 | Disposition: A | Discharge status: Home / Self Care | Attending: Obstetrics and Gynecology | Admitting: Obstetrics and Gynecology

## 2024-03-25 ENCOUNTER — Other Ambulatory Visit: Payer: Self-pay

## 2024-03-25 ENCOUNTER — Encounter (HOSPITAL_COMMUNITY): Payer: Self-pay | Admitting: Obstetrics and Gynecology

## 2024-03-25 DIAGNOSIS — Z8249 Family history of ischemic heart disease and other diseases of the circulatory system: Secondary | ICD-10-CM | POA: Diagnosis not present

## 2024-03-25 DIAGNOSIS — O4202 Full-term premature rupture of membranes, onset of labor within 24 hours of rupture: Secondary | ICD-10-CM | POA: Diagnosis not present

## 2024-03-25 DIAGNOSIS — O99820 Streptococcus B carrier state complicating pregnancy: Secondary | ICD-10-CM | POA: Diagnosis not present

## 2024-03-25 DIAGNOSIS — O480 Post-term pregnancy: Secondary | ICD-10-CM | POA: Diagnosis not present

## 2024-03-25 DIAGNOSIS — Z3A40 40 weeks gestation of pregnancy: Secondary | ICD-10-CM

## 2024-03-25 DIAGNOSIS — O99824 Streptococcus B carrier state complicating childbirth: Secondary | ICD-10-CM | POA: Diagnosis present

## 2024-03-25 DIAGNOSIS — Z833 Family history of diabetes mellitus: Secondary | ICD-10-CM

## 2024-03-25 DIAGNOSIS — Z7982 Long term (current) use of aspirin: Secondary | ICD-10-CM

## 2024-03-25 DIAGNOSIS — O411290 Chorioamnionitis, unspecified trimester, not applicable or unspecified: Secondary | ICD-10-CM | POA: Diagnosis not present

## 2024-03-25 DIAGNOSIS — O411230 Chorioamnionitis, third trimester, not applicable or unspecified: Secondary | ICD-10-CM | POA: Diagnosis present

## 2024-03-25 DIAGNOSIS — Z3400 Encounter for supervision of normal first pregnancy, unspecified trimester: Secondary | ICD-10-CM

## 2024-03-25 DIAGNOSIS — O99214 Obesity complicating childbirth: Secondary | ICD-10-CM | POA: Diagnosis present

## 2024-03-25 DIAGNOSIS — O26893 Other specified pregnancy related conditions, third trimester: Secondary | ICD-10-CM | POA: Diagnosis present

## 2024-03-25 LAB — CBC
HCT: 38.1 % (ref 36.0–46.0)
Hemoglobin: 12.8 g/dL (ref 12.0–15.0)
MCH: 30 pg (ref 26.0–34.0)
MCHC: 33.6 g/dL (ref 30.0–36.0)
MCV: 89.2 fL (ref 80.0–100.0)
Platelets: 208 K/uL (ref 150–400)
RBC: 4.27 MIL/uL (ref 3.87–5.11)
RDW: 14.3 % (ref 11.5–15.5)
WBC: 8.3 K/uL (ref 4.0–10.5)
nRBC: 0 % (ref 0.0–0.2)

## 2024-03-25 LAB — URINALYSIS, ROUTINE W REFLEX MICROSCOPIC
Bacteria, UA: NONE SEEN
Bilirubin Urine: NEGATIVE
Glucose, UA: NEGATIVE mg/dL
Ketones, ur: NEGATIVE mg/dL
Nitrite: NEGATIVE
Protein, ur: 30 mg/dL — AB
RBC / HPF: >50 RBC/hpf (ref 0–5)
Specific Gravity, Urine: 1.015 (ref 1.005–1.030)
pH: 6 (ref 5.0–8.0)

## 2024-03-25 LAB — POCT FERN TEST: POCT Fern Test: POSITIVE

## 2024-03-25 LAB — ABO/RH
ABO/RH(D): O POS
Antibody Screen: NEGATIVE

## 2024-03-25 MED ORDER — OXYTOCIN-SODIUM CHLORIDE 30-0.9 UT/500ML-% IV SOLN
2.5000 [IU]/h | INTRAVENOUS | Status: DC
  Filled 2024-03-25: qty 500

## 2024-03-25 MED ORDER — PHENYLEPHRINE 80 MCG/ML (10ML) SYRINGE FOR IV PUSH (FOR BLOOD PRESSURE SUPPORT): 80.0000 ug | PREFILLED_SYRINGE | INTRAVENOUS | Status: DC | PRN

## 2024-03-25 MED ORDER — SODIUM CHLORIDE 0.9 % IV SOLN
5.0000 10*6.[IU] | Freq: Once | INTRAVENOUS | Status: AC
  Administered 2024-03-25: 5 10*6.[IU] via INTRAVENOUS
  Filled 2024-03-25: qty 5

## 2024-03-25 MED ORDER — SOD CITRATE-CITRIC ACID 500-334 MG/5ML PO SOLN: 30.0000 mL | ORAL | Status: DC | PRN

## 2024-03-25 MED ORDER — OXYTOCIN BOLUS FROM INFUSION
333.0000 mL | Freq: Once | INTRAVENOUS | Status: AC
  Administered 2024-03-27: 333 mL via INTRAVENOUS

## 2024-03-25 MED ORDER — LIDOCAINE HCL (PF) 1 % IJ SOLN: 30.0000 mL | INTRAMUSCULAR | Status: DC | PRN

## 2024-03-25 MED ORDER — EPHEDRINE 5 MG/ML INJ: 10.0000 mg | INTRAVENOUS | Status: DC | PRN

## 2024-03-25 MED ORDER — LACTATED RINGERS IV SOLN: 500.0000 mL | Freq: Once | INTRAVENOUS | Status: DC

## 2024-03-25 MED ORDER — TERBUTALINE SULFATE 1 MG/ML IJ SOLN: 0.2500 mg | Freq: Once | INTRAMUSCULAR | Status: DC | PRN

## 2024-03-25 MED ORDER — FENTANYL CITRATE (PF) 100 MCG/2ML IJ SOLN
100.0000 ug | INTRAMUSCULAR | Status: DC | PRN
  Administered 2024-03-26 – 2024-03-27 (×4): 100 ug via INTRAVENOUS
  Filled 2024-03-25 (×4): qty 2

## 2024-03-25 MED ORDER — FENTANYL-BUPIVACAINE-NACL 0.5-0.125-0.9 MG/250ML-% EP SOLN
12.0000 mL/h | EPIDURAL | Status: DC | PRN
  Administered 2024-03-26 (×2): 12 mL/h via EPIDURAL
  Filled 2024-03-25 (×2): qty 250

## 2024-03-25 MED ORDER — LACTATED RINGERS IV SOLN: INTRAVENOUS | Status: AC

## 2024-03-25 MED ORDER — DIPHENHYDRAMINE HCL 50 MG/ML IJ SOLN: 12.5000 mg | INTRAMUSCULAR | Status: DC | PRN

## 2024-03-25 MED ORDER — ONDANSETRON HCL 4 MG/2ML IJ SOLN
4.0000 mg | Freq: Four times a day (QID) | INTRAMUSCULAR | Status: DC | PRN
  Administered 2024-03-26 (×2): 4 mg via INTRAVENOUS
  Filled 2024-03-25 (×2): qty 2

## 2024-03-25 MED ORDER — LACTATED RINGERS IV SOLN
500.0000 mL | INTRAVENOUS | Status: AC | PRN
  Administered 2024-03-26: 1000 mL via INTRAVENOUS

## 2024-03-25 MED ORDER — ACETAMINOPHEN 325 MG PO TABS: 650.0000 mg | ORAL_TABLET | ORAL | Status: DC | PRN

## 2024-03-25 MED ORDER — PENICILLIN G POT IN DEXTROSE 60000 UNIT/ML IV SOLN
3.0000 10*6.[IU] | INTRAVENOUS | Status: DC
  Administered 2024-03-26 (×6): 3 10*6.[IU] via INTRAVENOUS
  Filled 2024-03-25 (×6): qty 50

## 2024-03-25 MED ORDER — MISOPROSTOL 50MCG HALF TABLET
50.0000 ug | ORAL_TABLET | ORAL | Status: DC | PRN
  Administered 2024-03-26 (×2): 50 ug via ORAL
  Filled 2024-03-25 (×2): qty 1

## 2024-03-25 NOTE — MAU Note (Signed)
 Heidi Dean is a 24 y.o. at [redacted]w[redacted]d here in MAU reporting:   4pm bleeding with small penny side blood clots.  At 6pm- she noticed some LOF and more coming out before she got in the shower. +Fm.  6/10 cxts pain lower abdomen comes and goes.  Hip pain- 7/10 constant since 4pm.   2cm CE on Tuesday 23 December.  Epidural- eventually.  No blood products- Jehovah W.  Vitals:   03/25/24 1952  BP: 137/81  Pulse: 80  Resp: 17  Temp: 97.8 F (36.6 C)     FHT: 183 Lab orders placed from triage: [name]/ UA

## 2024-03-25 NOTE — MAU Note (Signed)
 Blood refusal Form consent signed. Hurr, MD made patient aware about cell saver and patient is okay with that.  Spanish waiver interpreter service signed.  Pt prefers Husband to interpret.   Both copies given to L and D.

## 2024-03-26 ENCOUNTER — Inpatient Hospital Stay (HOSPITAL_COMMUNITY): Payer: PRIVATE HEALTH INSURANCE | Admitting: Anesthesiology

## 2024-03-26 LAB — NO BLOOD PRODUCTS

## 2024-03-26 LAB — SYPHILIS: RPR W/REFLEX TO RPR TITER AND TREPONEMAL ANTIBODIES, TRADITIONAL SCREENING AND DIAGNOSIS ALGORITHM: RPR Ser Ql: NONREACTIVE

## 2024-03-26 MED ORDER — SODIUM CHLORIDE 0.9 % IV SOLN
2.0000 g | Freq: Four times a day (QID) | INTRAVENOUS | Status: DC
  Administered 2024-03-26: 2 g via INTRAVENOUS
  Filled 2024-03-26: qty 2000

## 2024-03-26 MED ORDER — GENTAMICIN SULFATE 40 MG/ML IJ SOLN
5.0000 mg/kg | Freq: Once | INTRAVENOUS | Status: AC
  Administered 2024-03-27: 340 mg via INTRAVENOUS
  Filled 2024-03-26: qty 8.5

## 2024-03-26 MED ORDER — TERBUTALINE SULFATE 1 MG/ML IJ SOLN: 0.2500 mg | Freq: Once | INTRAMUSCULAR | Status: DC | PRN

## 2024-03-26 MED ORDER — LIDOCAINE HCL (PF) 1 % IJ SOLN
INTRAMUSCULAR | Status: DC | PRN
  Administered 2024-03-26: 5 mL via EPIDURAL
  Administered 2024-03-26: 2 mL via EPIDURAL
  Administered 2024-03-26: 3 mL via EPIDURAL

## 2024-03-26 MED ORDER — OXYTOCIN-SODIUM CHLORIDE 30-0.9 UT/500ML-% IV SOLN
1.0000 m[IU]/min | INTRAVENOUS | Status: DC
  Administered 2024-03-26: 2 m[IU]/min via INTRAVENOUS

## 2024-03-26 MED ORDER — TRANEXAMIC ACID-NACL 1000-0.7 MG/100ML-% IV SOLN
1000.0000 mg | Freq: Once | INTRAVENOUS | Status: AC
  Administered 2024-03-27: 1000 mg via INTRAVENOUS
  Filled 2024-03-26: qty 100

## 2024-03-26 MED ORDER — ACETAMINOPHEN 500 MG PO TABS
1000.0000 mg | ORAL_TABLET | Freq: Once | ORAL | Status: AC
  Administered 2024-03-26: 1000 mg via ORAL
  Filled 2024-03-26: qty 2

## 2024-03-26 NOTE — Progress Notes (Signed)
 Heidi Dean is a 23 y.o. G1P0000 at [redacted]w[redacted]d admitted for rupture of membranes  Subjective: Pt feeling some lower abdominal pain, thigh pain, and rectal pressure with contractions.   Objective: BP 136/79   Pulse 87   Temp (!) 100.9 F (38.3 C) (Axillary)   Resp 20   Ht 5' 2 (1.575 m)   Wt 96.2 kg   LMP 05/28/2023 (Approximate)   SpO2 100%   BMI 38.78 kg/m  I/O last 3 completed shifts: In: -  Out: 550 [Urine:550] Total I/O In: -  Out: 150 [Urine:150]  FHT:  FHR: 150 bpm, variability: moderate,  accelerations:  Present,  decelerations:  Absent UC:   regular, every 3-4 minutes SVE:   Dilation: 10 Effacement (%): 100 Station: Plus 1 Exam by:: [employer]  Labs: Lab Results  Component Value Date   WBC 8.3 03/25/2024   HGB 12.8 03/25/2024   HCT 38.1 03/25/2024   MCV 89.2 03/25/2024   PLT 208 03/25/2024    Assessment / Plan: Augmentation of labor, with slow but steady progress Triple I with temperature of 100.8 at this time and brief episode of fetal tachycardia  Labor: Plan to start pushing. Amp/Gent/Tylenol  ordered for Triple I. IV fluid bolus.   Preeclampsia:  n/a Fetal Wellbeing:  Category I Pain Control:  Epidural I/D:  GBS positive, on PCN, cancelled to initiate Amp/Gent Anticipated MOD:  NSVD  Olam Boards, CNM 03/26/2024, 11:35 PM

## 2024-03-26 NOTE — H&P (Signed)
 OBSTETRIC ADMISSION HISTORY AND PHYSICAL  Heidi Dean is a 23 y.o. female G1P0000 with IUP at [redacted]w[redacted]d (dated by 6w US , Estimated Date of Delivery: 03/26/24) presenting for SROM at 1800 on 03/25/2024.Heidi Dean   She reports +FMs, No LOF, no VB, no blurry vision, headaches or peripheral edema, and RUQ pain.    She plans on breast feeding. She request condoms for birth control.  She received her prenatal care at [facility]   Prenatal History/Complications: N/A  Past Medical History: Past Medical History:  Diagnosis Date   Medical history non-contributory    No pertinent past medical history     Past Surgical History: Past Surgical History:  Procedure Laterality Date   NO PAST SURGERIES      Obstetrical History: OB History     Gravida  1   Para  0   Term  0   Preterm  0   AB  0   Living  0      SAB  0   IAB  0   Ectopic  0   Multiple  0   Live Births  0           Social History Social History   Socioeconomic History   Marital status: Married    Spouse name: Not on file   Number of children: 0   Years of education: Not on file   Highest education level: Not on file  Occupational History   Not on file  Tobacco Use   Smoking status: Never   Smokeless tobacco: Never  Vaping Use   Vaping status: Never Used  Substance and Sexual Activity   Alcohol use: Not Currently    Comment: ocassionally   Drug use: Never   Sexual activity: Not Currently    Birth control/protection: None  Other Topics Concern   Not on file  Social History Narrative   Not on file   Social Drivers of Health   Tobacco Use: Low Risk (03/25/2024)   Patient History    Smoking Tobacco Use: Never    Smokeless Tobacco Use: Never    Passive Exposure: Not on file  Financial Resource Strain: Low Risk (09/13/2023)   Overall Financial Resource Strain (CARDIA)    Difficulty of Paying Living Expenses: Not hard at all  Food Insecurity: No Food Insecurity (03/25/2024)   Epic     Worried About Radiation Protection Practitioner of Food in the Last Year: Never true    Ran Out of Food in the Last Year: Never true  Transportation Needs: No Transportation Needs (03/25/2024)   Epic    Lack of Transportation (Medical): No    Lack of Transportation (Non-Medical): No  Physical Activity: Insufficiently Active (09/13/2023)   Exercise Vital Sign    Days of Exercise per Week: 1 day    Minutes of Exercise per Session: 10 min  Stress: No Stress Concern Present (09/13/2023)   Heidi Dean of Occupational Health - Occupational Stress Questionnaire    Feeling of Stress: Not at all  Social Connections: Socially Integrated (09/13/2023)   Social Connection and Isolation Panel    Frequency of Communication with Friends and Family: More than three times a week    Frequency of Social Gatherings with Friends and Family: Once a week    Attends Religious Services: More than 4 times per year    Active Member of [employer] or Organizations: Yes    Attends Engineer, Structural: More than 4 times per year    Marital Status: Married  Depression (PHQ2-9): Low Risk (01/04/2024)   Depression (PHQ2-9)    PHQ-2 Score: 1  Alcohol Screen: Low Risk (09/13/2023)   Alcohol Screen    Last Alcohol Screening Score (AUDIT): 1  Housing: Unknown (03/25/2024)   Epic    Unable to Pay for Housing in the Last Year: No    Number of Times Moved in the Last Year: Not on file    Homeless in the Last Year: No  Utilities: Not At Risk (03/25/2024)   Epic    Threatened with loss of utilities: No  Health Literacy: Adequate Health Literacy (09/13/2023)   B1300 Health Literacy    Frequency of need for help with medical instructions: Never    Family History: Family History  Problem Relation Age of Onset   Cancer Mother    Diabetes Maternal Grandmother    Hypertension Maternal Grandfather    Cancer Paternal Grandmother     Allergies: Allergies[1]  Medications Prior to Admission  Medication Sig Dispense Refill Last  Dose/Taking   aspirin  EC 81 MG tablet Take 1 tablet (81 mg total) by mouth daily. Swallow whole. 90 tablet 3 03/24/2024   prenatal vitamin w/FE, FA (PRENATAL 1 + 1) 27-1 MG TABS tablet Take 1 tablet by mouth daily at 12 noon. 30 tablet 12 03/25/2024   albuterol  (VENTOLIN  HFA) 108 (90 Base) MCG/ACT inhaler Inhale 2 puffs into the lungs every 6 (six) hours as needed. (Patient not taking: Reported on 03/14/2024) 8 g 0    Blood Pressure Monitor MISC For regular home bp monitoring during pregnancy 1 each 0    Doxylamine -Pyridoxine  (DICLEGIS ) 10-10 MG TBEC 2 tabs q hs, if sx persist add 1 tab q am on day 3, if sx persist add 1 tab q afternoon on day 4 (Patient not taking: Reported on 03/14/2024) 100 tablet 6    fluticasone  (FLONASE ) 50 MCG/ACT nasal spray Place 2 sprays into both nostrils daily. (Patient not taking: Reported on 03/14/2024) 16 g 0    metoCLOPramide  (REGLAN ) 10 MG tablet Take 1 tablet (10 mg total) by mouth 4 (four) times daily. (Patient not taking: Reported on 03/14/2024) 120 tablet 3      Review of Systems  All systems reviewed and negative except as stated in HPI.  Blood pressure (!) 121/58, pulse 66, temperature 98 F (36.7 C), temperature source Oral, resp. rate 18, height 5' 2 (1.575 m), weight 96.2 kg, last menstrual period 05/28/2023, SpO2 100%. General appearance: alert, cooperative, and appears stated age Lungs: breathing comfortably on room air Heart: regular rate Abdomen: soft, non-tender; gravid Extremities: no edema of bilateral lower extremities DTR's intact Presentation: cephalic Fetal monitoringBaseline: 130 bpm, Variability: Good {> 6 bpm), Accelerations: Reactive, and Decelerations: Absent  Dilation: 1.5 Effacement (%): 40, 50 Station: -3 Exam by:: Lum Sharps RN   Prenatal labs: ABO, Rh: --/--/O POS (12/29 2158) Antibody: NEG Performed at [facility], [address], [place], [place] [address]  [phone]/29 2158) Rubella: 3.95 (06/18 1525) RPR:  Non Reactive (10/09 0824)  HBsAg: Negative (06/18 1525)  HIV: Non Reactive (10/09 0824)  GBS: Positive/-- (12/02 0205)  2 hr Glucola normal Genetic screening  LR female Anatomy US  Appears normal Last US : At [redacted]w[redacted]d - breech presentation, EFW 374g (77% %tile),   Prenatal Transfer Tool  Maternal Diabetes: No Genetic Screening: Normal Maternal Ultrasounds/Referrals: Normal Fetal Ultrasounds or other Referrals:  None Maternal Substance Abuse:  No Significant Maternal Medications:  None Significant Maternal Lab Results:  Group B Strep positive Number of  Prenatal Visits:greater than 3 verified prenatal visits Other Comments:  None  Results for orders placed or performed during the hospital encounter of 03/25/24 (from the past 24 hours)  Urinalysis, Routine w reflex microscopic -Urine, Clean Catch   Collection Time: 03/25/24  7:59 PM  Result Value Ref Range   Color, Urine YELLOW YELLOW   APPearance HAZY (A) CLEAR   Specific Gravity, Urine 1.015 1.005 - 1.030   pH 6.0 5.0 - 8.0   Glucose, UA NEGATIVE NEGATIVE mg/dL   Hgb urine dipstick LARGE (A) NEGATIVE   Bilirubin Urine NEGATIVE NEGATIVE   Ketones, ur NEGATIVE NEGATIVE mg/dL   Protein, ur 30 (A) NEGATIVE mg/dL   Nitrite NEGATIVE NEGATIVE   Leukocytes,Ua SMALL (A) NEGATIVE   RBC / HPF >50 0 - 5 RBC/hpf   WBC, UA 6-10 0 - 5 WBC/hpf   Bacteria, UA NONE SEEN NONE SEEN   Squamous Epithelial / HPF 21-50 0 - 5 /HPF   Mucus PRESENT   Fern Test   Collection Time: 03/25/24  8:10 PM  Result Value Ref Range   POCT Fern Test Positive = ruptured amniotic membanes   ABO/Rh   Collection Time: 03/25/24  9:58 PM  Result Value Ref Range   ABO/RH(D) O POS    Antibody Screen      NEG Performed at [facility], [address] [place], [place] [address]   No blood products   Collection Time: 03/25/24  9:58 PM  Result Value Ref Range   Transfuse no blood products      TRANSFUSE NO BLOOD PRODUCTS, VERIFIED BY P.ALVAREZ,RN 2213  03/25/2024 Performed at [facility], [address], [place], [place] [address]   CBC   Collection Time: 03/25/24  9:59 PM  Result Value Ref Range   WBC 8.3 4.0 - 10.5 K/uL   RBC 4.27 3.87 - 5.11 MIL/uL   Hemoglobin 12.8 12.0 - 15.0 g/dL   HCT 61.8 63.9 - 53.9 %   MCV 89.2 80.0 - 100.0 fL   MCH 30.0 26.0 - 34.0 pg   MCHC 33.6 30.0 - 36.0 g/dL   RDW 85.6 88.4 - 84.4 %   Platelets 208 150 - 400 K/uL   nRBC 0.0 0.0 - 0.2 %    Patient Active Problem List   Diagnosis Date Noted   Post-dates pregnancy 03/25/2024   History of abnormal cervical Pap smear 12/06/2023   Supervision of normal first pregnancy, antepartum 09/13/2023    Assessment/Plan:  Heidi Dean is a 23 y.o. G1P0000 at [redacted]w[redacted]d here for SROM  #Labor: Cervix still long so will start with cyotec for cervical ripening.  #Pain: Per patient request, planning epidural #FWB: Cat I #ID:  GBS (+) > PCN #MOF: Breast #MOC: Condoms #Circ:  N/A  Barkley Angles, MD OB Fellow, Faculty Practice [place], Center for [facility] 03/26/2024 1:39 AM        [1] No Known Allergies

## 2024-03-26 NOTE — Anesthesia Preprocedure Evaluation (Signed)
"                                    Anesthesia Evaluation  Patient identified by MRN, date of birth, ID band Patient awake    Reviewed: Allergy & Precautions, NPO status , Patient's Chart, lab work & pertinent test results  Airway Mallampati: II  TM Distance: >3 FB Neck ROM: Full    Dental  (+) Teeth Intact, Dental Advisory Given   Pulmonary neg pulmonary ROS   Pulmonary exam normal breath sounds clear to auscultation       Cardiovascular negative cardio ROS Normal cardiovascular exam Rhythm:Regular Rate:Normal     Neuro/Psych negative neurological ROS     GI/Hepatic negative GI ROS, Neg liver ROS,,,  Endo/Other  negative endocrine ROS  Obesity   Renal/GU negative Renal ROS     Musculoskeletal negative musculoskeletal ROS (+)    Abdominal   Peds  Hematology negative hematology ROS (+) Plt 208k   Anesthesia Other Findings Day of surgery medications reviewed with the patient.  Reproductive/Obstetrics (+) Pregnancy                              Anesthesia Physical Anesthesia Plan  ASA: 2  Anesthesia Plan: Epidural   Post-op Pain Management:    Induction:   PONV Risk Score and Plan: 2 and Treatment may vary due to age or medical condition  Airway Management Planned: Natural Airway  Additional Equipment:   Intra-op Plan:   Post-operative Plan:   Informed Consent: I have reviewed the patients History and Physical, chart, labs and discussed the procedure including the risks, benefits and alternatives for the proposed anesthesia with the patient or authorized representative who has indicated his/her understanding and acceptance.     Dental advisory given and Interpreter used for interview  Plan Discussed with:   Anesthesia Plan Comments: (Patient identified. Risks/Benefits/Options discussed with patient including but not limited to bleeding, infection, nerve damage, paralysis, failed block, incomplete pain  control, headache, blood pressure changes, nausea, vomiting, reactions to medication both or allergic, itching and postpartum back pain. Confirmed with bedside nurse the patient's most recent platelet count. Confirmed with patient that they are not currently taking any anticoagulation, have any bleeding history or any family history of bleeding disorders. Patient expressed understanding and wished to proceed. All questions were answered.   Interpreter [phone])         Anesthesia Quick Evaluation  "

## 2024-03-26 NOTE — Consult Note (Signed)
 ANTIBIOTIC CONSULT NOTE - INITIAL  Pharmacy Consult for Gentamicin Indication: Chorioamnionitis   Allergies[1]  Patient Measurements: Height: 5' 2 (157.5 cm) Weight: 96.2 kg (212 lb) IBW/kg (Calculated) : 50.1 Adjusted Body Weight: 68.5 kg  Vital Signs: Temp: 100.9 F (38.3 C) (12/30 2310) Temp Source: Axillary (12/30 2310) BP: 136/79 (12/30 2300) Pulse Rate: 87 (12/30 2300)  Labs: Recent Labs    03/25/24 2159  WBC 8.3  HGB 12.8  PLT 208   No results for input(s): GENTTROUGH, GENTPEAK, GENTRANDOM in the last 72 hours.   Microbiology: Recent Results (from the past 720 hours)  Culture, beta strep (group b only)     Status: Abnormal   Collection Time: 02/27/24  2:05 AM   Specimen: Vaginal/Rectal; Genital   VA  Result Value Ref Range Status   Strep Gp B Culture Positive (A) Negative Final    Comment: Centers for Disease Control and Prevention (CDC) and American Congress of Obstetricians and Gynecologists (ACOG) guidelines for prevention of perinatal group B streptococcal (GBS) disease specify co-collection of a vaginal and rectal swab specimen to maximize sensitivity of GBS detection. Per the CDC and ACOG, swabbing both the lower vagina and rectum substantially increases the yield of detection compared with sampling the vagina alone. Penicillin  G, ampicillin , or cefazolin are indicated for intrapartum prophylaxis of perinatal GBS colonization. Reflex susceptibility testing should be performed prior to use of clindamycin only on GBS isolates from penicillin -allergic women who are considered a high risk for anaphylaxis. Treatment with vancomycin without additional testing is warranted if resistance to clindamycin is noted.     Medications:  Ampicillin  2gm q6h  Assessment: 23 y.o. female G1P0000 at [redacted]w[redacted]d   Goal of Therapy:  Gentamicin peak 6-8 mg/L and Trough < 1 mg/L  Plan:  Gentamicin 5 mg/kg IV x 1  Check Scr with next labs if gentamicin  continued. Will check gentamicin levels if continued > 72hr or clinically indicated.  Lavell Ridings S Collyn Selk 03/26/2024,11:30 PM     [1] No Known Allergies

## 2024-03-26 NOTE — Progress Notes (Signed)
 Patient ID: Marcella Zuluaga-Alquisiras, female   DOB: January 13, 2001, 23 y.o.   MRN: [id number]  BP (!) 140/86   Pulse 71   Temp 98.6 F (37 C) (Oral)   Resp 16   Ht 5' 2 (1.575 m)   Wt 96.2 kg   LMP 05/28/2023 (Approximate)   SpO2 98%   BMI 38.78 kg/m   Dilation: 4.5 Effacement (%): 90 Cervical Position: Posterior Station: -1 Presentation: Vertex Exam by: Frazier Balfour, CNM   Assessment/Plan: -  Labor progressing well. CNM to bedside to assess for AROM. No forebag felt by     CNM. Will plan to start Pitocin , if contractions space out.

## 2024-03-26 NOTE — Anesthesia Procedure Notes (Signed)
 Epidural Patient location during procedure: OB Start time: 03/26/2024 8:02 AM End time: 03/26/2024 8:09 AM  Staffing Anesthesiologist: Corinne Garnette BRAVO, MD Performed: anesthesiologist   Preanesthetic Checklist Completed: patient identified, IV checked, risks and benefits discussed, monitors and equipment checked, pre-op evaluation and timeout performed  Epidural Patient position: sitting Prep: DuraPrep Patient monitoring: blood pressure and continuous pulse ox Approach: midline Location: L3-L4 Injection technique: LOR air  Needle:  Needle type: Tuohy  Needle gauge: 17 G Needle length: 9 cm Needle insertion depth: 6 cm Catheter size: 19 Gauge Catheter at skin depth: 11 cm Test dose: negative and Other (1% Lidocaine )  Additional Notes Patient identified.  Risk benefits discussed including failed block, incomplete pain control, headache, nerve damage, paralysis, blood pressure changes, nausea, vomiting, reactions to medication both toxic or allergic, and postpartum back pain.  Patient expressed understanding and wished to proceed.  All questions were answered.  Sterile technique used throughout procedure and epidural site dressed with sterile barrier dressing. No paresthesia or other complications noted. The patient did not experience any signs of intravascular injection such as tinnitus or metallic taste in mouth nor signs of intrathecal spread such as rapid motor block. Please see nursing notes for vital signs. Reason for block:procedure for pain

## 2024-03-27 ENCOUNTER — Encounter (HOSPITAL_COMMUNITY): Payer: Self-pay | Admitting: Obstetrics & Gynecology

## 2024-03-27 ENCOUNTER — Encounter: Payer: Self-pay | Admitting: Obstetrics and Gynecology

## 2024-03-27 ENCOUNTER — Other Ambulatory Visit

## 2024-03-27 ENCOUNTER — Encounter: Admitting: Women's Health

## 2024-03-27 DIAGNOSIS — O411290 Chorioamnionitis, unspecified trimester, not applicable or unspecified: Secondary | ICD-10-CM | POA: Diagnosis not present

## 2024-03-27 DIAGNOSIS — Z789 Other specified health status: Secondary | ICD-10-CM | POA: Insufficient documentation

## 2024-03-27 DIAGNOSIS — O480 Post-term pregnancy: Secondary | ICD-10-CM

## 2024-03-27 DIAGNOSIS — O4202 Full-term premature rupture of membranes, onset of labor within 24 hours of rupture: Secondary | ICD-10-CM

## 2024-03-27 DIAGNOSIS — Z3A40 40 weeks gestation of pregnancy: Secondary | ICD-10-CM

## 2024-03-27 DIAGNOSIS — O411230 Chorioamnionitis, third trimester, not applicable or unspecified: Secondary | ICD-10-CM

## 2024-03-27 DIAGNOSIS — O99820 Streptococcus B carrier state complicating pregnancy: Secondary | ICD-10-CM

## 2024-03-27 LAB — DIC (DISSEMINATED INTRAVASCULAR COAGULATION)PANEL
D-Dimer, Quant: 3.58 ug{FEU}/mL — ABNORMAL HIGH (ref 0.00–0.50)
Fibrinogen: 455 mg/dL (ref 210–475)
INR: 1 (ref 0.8–1.2)
Platelets: 192 K/uL (ref 150–400)
Prothrombin Time: 13.4 s (ref 11.4–15.2)
Smear Review: NONE SEEN
aPTT: 29 s (ref 24–36)

## 2024-03-27 LAB — CBC
HCT: 37.5 % (ref 36.0–46.0)
HCT: 34.3 % — ABNORMAL LOW (ref 36.0–46.0)
Hemoglobin: 12.8 g/dL (ref 12.0–15.0)
Hemoglobin: 12 g/dL (ref 12.0–15.0)
MCH: 30.5 pg (ref 26.0–34.0)
MCH: 30.5 pg (ref 26.0–34.0)
MCHC: 34.1 g/dL (ref 30.0–36.0)
MCHC: 35 g/dL (ref 30.0–36.0)
MCV: 89.3 fL (ref 80.0–100.0)
MCV: 87.1 fL (ref 80.0–100.0)
Platelets: 175 K/uL (ref 150–400)
Platelets: 189 K/uL (ref 150–400)
RBC: 4.2 MIL/uL (ref 3.87–5.11)
RBC: 3.94 MIL/uL (ref 3.87–5.11)
RDW: 14.4 % (ref 11.5–15.5)
RDW: 14.2 % (ref 11.5–15.5)
WBC: 14.7 K/uL — ABNORMAL HIGH (ref 4.0–10.5)
WBC: 17.1 K/uL — ABNORMAL HIGH (ref 4.0–10.5)
nRBC: 0 % (ref 0.0–0.2)
nRBC: 0 % (ref 0.0–0.2)

## 2024-03-27 LAB — POSTPARTUM HEMORRHAGE (BB NOTIFICATION)

## 2024-03-27 MED ORDER — LACTATED RINGERS IV SOLN: INTRAVENOUS | Status: DC

## 2024-03-27 MED ORDER — ACETAMINOPHEN 325 MG PO TABS
650.0000 mg | ORAL_TABLET | ORAL | Status: DC | PRN
  Administered 2024-03-27: 650 mg via ORAL
  Filled 2024-03-27: qty 2

## 2024-03-27 MED ORDER — TRANEXAMIC ACID-NACL 1000-0.7 MG/100ML-% IV SOLN
INTRAVENOUS | Status: AC
  Filled 2024-03-27: qty 100

## 2024-03-27 MED ORDER — DIBUCAINE (PERIANAL) 1 % EX OINT: 1.0000 | TOPICAL_OINTMENT | CUTANEOUS | Status: DC | PRN

## 2024-03-27 MED ORDER — TRANEXAMIC ACID-NACL 1000-0.7 MG/100ML-% IV SOLN: 1000.0000 mg | INTRAVENOUS | Status: DC

## 2024-03-27 MED ORDER — TETANUS-DIPHTH-ACELL PERTUSSIS 5-2-15.5 LF-MCG/0.5 IM SUSP: 0.5000 mL | Freq: Once | INTRAMUSCULAR | Status: DC

## 2024-03-27 MED ORDER — SIMETHICONE 80 MG PO CHEW: 80.0000 mg | CHEWABLE_TABLET | ORAL | Status: DC | PRN

## 2024-03-27 MED ORDER — SENNOSIDES-DOCUSATE SODIUM 8.6-50 MG PO TABS
2.0000 | ORAL_TABLET | Freq: Every day | ORAL | Status: DC
  Administered 2024-03-28 – 2024-03-29 (×2): 2 via ORAL
  Filled 2024-03-27 (×2): qty 2

## 2024-03-27 MED ORDER — DIPHENHYDRAMINE HCL 25 MG PO CAPS: 25.0000 mg | ORAL_CAPSULE | Freq: Four times a day (QID) | ORAL | Status: DC | PRN

## 2024-03-27 MED ORDER — BENZOCAINE-MENTHOL 20-0.5 % EX AERO: 1.0000 | INHALATION_SPRAY | CUTANEOUS | Status: DC | PRN

## 2024-03-27 MED ORDER — ZOLPIDEM TARTRATE 5 MG PO TABS: 5.0000 mg | ORAL_TABLET | Freq: Every evening | ORAL | Status: DC | PRN

## 2024-03-27 MED ORDER — METHYLERGONOVINE MALEATE 0.2 MG/ML IJ SOLN
INTRAMUSCULAR | Status: AC
  Filled 2024-03-27: qty 1

## 2024-03-27 MED ORDER — WITCH HAZEL-GLYCERIN EX PADS: 1.0000 | MEDICATED_PAD | CUTANEOUS | Status: DC | PRN

## 2024-03-27 MED ORDER — CEFAZOLIN SODIUM-DEXTROSE 2-4 GM/100ML-% IV SOLN
2.0000 g | Freq: Once | INTRAVENOUS | Status: AC
  Administered 2024-03-27: 2 g via INTRAVENOUS
  Filled 2024-03-27: qty 100

## 2024-03-27 MED ORDER — PRENATAL MULTIVITAMIN CH
1.0000 | ORAL_TABLET | Freq: Every day | ORAL | Status: DC
  Administered 2024-03-27 – 2024-03-29 (×3): 1 via ORAL
  Filled 2024-03-27 (×3): qty 1

## 2024-03-27 MED ORDER — LACTATED RINGERS IV BOLUS
1000.0000 mL | Freq: Once | INTRAVENOUS | Status: AC
  Administered 2024-03-27: 1000 mL via INTRAVENOUS

## 2024-03-27 MED ORDER — COCONUT OIL OIL: 1.0000 | TOPICAL_OIL | Status: DC | PRN

## 2024-03-27 MED ORDER — SODIUM CHLORIDE 0.9 % IV SOLN
2.0000 g | Freq: Four times a day (QID) | INTRAVENOUS | Status: AC
  Administered 2024-03-27 (×3): 2 g via INTRAVENOUS
  Filled 2024-03-27 (×3): qty 2000

## 2024-03-27 MED ORDER — OXYCODONE HCL 5 MG PO TABS: 10.0000 mg | ORAL_TABLET | ORAL | Status: DC | PRN

## 2024-03-27 MED ORDER — IBUPROFEN 600 MG PO TABS
600.0000 mg | ORAL_TABLET | Freq: Four times a day (QID) | ORAL | Status: DC
  Administered 2024-03-27 – 2024-03-29 (×10): 600 mg via ORAL
  Filled 2024-03-27 (×10): qty 1

## 2024-03-27 MED ORDER — ONDANSETRON HCL 4 MG PO TABS: 4.0000 mg | ORAL_TABLET | ORAL | Status: DC | PRN

## 2024-03-27 MED ORDER — LACTATED RINGERS IV SOLN: 500.0000 mL | INTRAVENOUS | Status: DC | PRN

## 2024-03-27 MED ORDER — METHYLERGONOVINE MALEATE 0.2 MG/ML IJ SOLN: 0.2000 mg | Freq: Once | INTRAMUSCULAR | Status: DC

## 2024-03-27 MED ORDER — OXYCODONE HCL 5 MG PO TABS: 5.0000 mg | ORAL_TABLET | ORAL | Status: DC | PRN

## 2024-03-27 MED ORDER — ONDANSETRON HCL 4 MG/2ML IJ SOLN: 4.0000 mg | INTRAMUSCULAR | Status: DC | PRN

## 2024-03-27 MED ORDER — MISOPROSTOL 200 MCG PO TABS
ORAL_TABLET | ORAL | Status: AC
  Filled 2024-03-27: qty 4

## 2024-03-27 NOTE — Progress Notes (Signed)
 Chaplain responded to Code Hemorrhage. Spoke with nurse who reported all is well. Did not visit with patient.  Rock Orange Chaplain

## 2024-03-27 NOTE — Progress Notes (Signed)
 Heidi Dean is a 23 y.o. G1P0000 at [redacted]w[redacted]d admitted for SROM  Subjective: Pt comfortable with epidural. S/O in room for support.   Objective: BP (!) 145/85   Pulse 77   Temp (!) 100.9 F (38.3 C) (Axillary)   Resp 20   Ht 5' 2 (1.575 m)   Wt 96.2 kg   LMP 05/28/2023 (Approximate)   SpO2 99%   BMI 38.78 kg/m  I/O last 3 completed shifts: In: -  Out: 550 [Urine:550] Total I/O In: -  Out: 150 [Urine:150]  FHT:  FHR: 145 bpm, variability: moderate,  accelerations:  Abscent,  decelerations:  Present variables with pushing and prolonged x 1 UC:   regular, every 3-5 minutes, lasting 1-2.5 minutes SVE:   Dilation: 10 Effacement (%): 100 Station: Plus 1 Exam by:: [employer]  Labs: Lab Results  Component Value Date   WBC 8.3 03/25/2024   HGB 12.8 03/25/2024   HCT 38.1 03/25/2024   MCV 89.2 03/25/2024   PLT 208 03/25/2024    Assessment / Plan: Augmentation for SROM 30+ hours   Labor: Progressing normally. CNM called to room for prolonged deceleration. Resolved with position change.  Pitocin  decreased from 10 to 4.  Plan to resume pushing after fetal recovery.   Preeclampsia:  n/a Fetal Wellbeing:  Category II Pain Control:  Epidural I/D:  Triple I on Amp/Gent Anticipated MOD:  NSVD  Olam Boards, CNM 03/27/2024, 12:18 AM

## 2024-03-27 NOTE — Progress Notes (Signed)
 Post Partum Day 0  Subjective: Reports pain well controlled   Objective: Blood pressure 118/78, pulse 73, temperature 98.2 F (36.8 C), temperature source Oral, resp. rate 18, height 5' 2 (1.575 m), weight 96.2 kg, last menstrual period 05/28/2023, SpO2 100%, unknown if currently breastfeeding.  Physical Exam:  General: alert, cooperative, and no distress Lochia: appropriate Uterine Fundus: firm Incision: n/a DVT Evaluation: No evidence of DVT seen on physical exam.  Recent Labs    03/27/24 0432 03/27/24 0809  HGB 12.8 12.0  HCT 37.5 34.3*    Assessment/Plan: Jada removed this morning, had some residual bleeding.  Uterus is firm with scan bleeding. Will have one more fundal check and if bleeding stable will d/c foley Hgb 12.8> 12 this AM  D/c amp and gent @ 24hrs   LOS: 2 days   Nidia Daring, FNP

## 2024-03-27 NOTE — Anesthesia Postprocedure Evaluation (Signed)
"   Anesthesia Post Note  Patient: Heidi Dean  Procedure(s) Performed: AN AD HOC LABOR EPIDURAL     Patient location during evaluation: Mother Baby Anesthesia Type: Epidural Level of consciousness: awake and alert Pain management: pain level controlled Vital Signs Assessment: post-procedure vital signs reviewed and stable Respiratory status: spontaneous breathing, nonlabored ventilation and respiratory function stable Cardiovascular status: stable Postop Assessment: no headache, no backache and epidural receding Anesthetic complications: no   No notable events documented.  Last Vitals:  Vitals:   03/27/24 0620 03/27/24 0647  BP: 136/85 126/83  Pulse: (!) 248 66  Resp:  18  Temp:  37 C  SpO2:  98%    Last Pain:  Vitals:   03/27/24 0647  TempSrc: Oral  PainSc: 6    Pain Goal:                   [name]      "

## 2024-03-27 NOTE — Progress Notes (Signed)
 OB Note Patient feels well AF VS normal and stable Firm fundus and no bleeding. Jada with minimal output in tube only and foley with clear UOP approx 4-534mL since placement  Will send patient to PP floor with Jada still to suction, close to and reassess after 9am labs, and possible remove of Jada and foley at that time  Bebe Izell Raddle MD Attending Center for [employer] (Faculty Practice) 03/27/2024 Time: [phone]

## 2024-03-27 NOTE — Discharge Summary (Addendum)
 "    Postpartum Discharge Summary  Date of Service updated***     Patient Name: Heidi Dean DOB: [birth year] MRN: [id number]  Date of admission: 03/25/2024 Delivery date:03/27/2024 Delivering provider: MILLY PLANAS A Date of discharge: 03/27/2024  Admitting diagnosis: Post-dates pregnancy [O48.0] Intrauterine pregnancy: [redacted]w[redacted]d     Secondary diagnosis:  Principal Problem:   Post-dates pregnancy  Additional problems: postpartum hemorrhage    Discharge diagnosis: Term Pregnancy Delivered                                              Post partum procedures:{Postpartum procedures:23558} Augmentation: Pitocin  and Cytotec  Complications: Hemorrhage>1061mL and ROM>24 hours  Hospital course: Onset of Labor With Vaginal Delivery      23 y.o. yo G1P1001 at [redacted]w[redacted]d was admitted in Latent Labor on 03/25/2024. Labor course was complicated by Triple I with fever 100.9 and thick meconium stained amniotic fluid.  Membrane Rupture Time/Date: 6:00 PM,03/25/2024  Delivery Method:Vaginal, Spontaneous Operative Delivery:N/A Episiotomy: None Lacerations:  2nd degree;Perineal Patient had a postpartum course complicated by postpartum hemorrhage with JADA placement.    She is ambulating, tolerating a regular diet, passing flatus, and urinating well. Patient is discharged home in stable condition on 03/27/2024.  Newborn Data: Birth date:03/27/2024 Birth time:4:04 AM Gender:Female Living status:Living Apgars:5 ,9  Weight:4180 g  Magnesium Sulfate received: No BMZ received: No Rhophylac:No MMR:No T-DaP:Given prenatally Flu: Yes RSV Vaccine received: Yes Transfusion:{Transfusion received:30440034} Jehovah Witness. Pt does not accept blood products.  Immunizations received: Immunization History  Administered Date(s) Administered    sv, Bivalent, Protein Subunit Rsvpref,pf [place]) 02/26/2024   Influenza, Seasonal, Injecte, Preservative Fre 01/25/2024   Tdap 01/25/2024     Physical exam  Vitals:   03/27/24 0442 03/27/24 0450 03/27/24 0500 03/27/24 0516  BP: 129/79 (!) 117/91 (!) 132/90 (!) 129/52  Pulse: 82 74 76 69  Resp:      Temp:      TempSrc:      SpO2:      Weight:      Height:       General: {Exam; general:21111117} Lochia: {Desc; appropriate/inappropriate:30686::appropriate} Uterine Fundus: {Desc; firm/soft:30687} Incision: {Exam; incision:21111123} DVT Evaluation: {Exam; dvt:2111122} Labs: Lab Results  Component Value Date   WBC 14.7 (H) 03/27/2024   HGB 12.8 03/27/2024   HCT 37.5 03/27/2024   MCV 89.3 03/27/2024   PLT 192 03/27/2024   PLT 175 03/27/2024      Latest Ref Rng & Units 12/30/2023   10:26 PM  CMP  Glucose 70 - 99 mg/dL 883   BUN 6 - 20 mg/dL 6   Creatinine 9.55 - 8.99 mg/dL 9.44   Sodium 864 - 854 mmol/L 140   Potassium 3.5 - 5.1 mmol/L 3.5   Chloride 98 - 111 mmol/L 107   CO2 22 - 32 mmol/L 19   Calcium 8.9 - 10.3 mg/dL 8.8   Total Protein 6.5 - 8.1 g/dL 6.2   Total Bilirubin 0.0 - 1.2 mg/dL 0.2   Alkaline Phos 38 - 126 U/L 93   AST 15 - 41 U/L 15   ALT 0 - 44 U/L 16    Edinburgh Score:     No data to display         No data recorded  After visit meds:  Allergies as of 03/27/2024   No Known Allergies   Med Rec must be completed prior  to using this [facility]***        Discharge home in stable condition Infant Feeding: {Baby feeding:23562} Infant Disposition:{CHL IP OB HOME WITH FNUYZM:76418} Discharge instruction: per After Visit Summary and Postpartum booklet. Activity: Advance as tolerated. Pelvic rest for 6 weeks.  Diet: {OB ipzu:78888878} Future Appointments: Future Appointments  Date Time Provider Department Center  03/27/2024  1:30 PM [facility] NURSE [facility]  03/27/2024  1:50 PM Kizzie Suzen SAUNDERS, CNM [facility]   Follow up Visit:  Message sent to [facility] on 03/27/24:  Please schedule this patient for a In person postpartum visit in 6 weeks with the  following provider: Any provider. Additional Postpartum F/U:IV iron infusion outpatient  Low risk pregnancy complicated by: postterm, PROM Delivery mode:  Vaginal, Spontaneous Anticipated Birth Control:  Condoms   03/27/2024 Heidi Dean, CNM    "

## 2024-03-27 NOTE — Lactation Note (Signed)
 This note was copied from a baby's chart. Lactation Consultation Note  Patient Name: Heidi Dean Date: 03/27/2024 Age:24 hours Reason for consult: Initial assessment;Primapara;1st time breastfeeding;Term  P1- MOB's feeding plan is to offer both breast milk and formula. MOB had a total blood loss of 2311. RN informed LC that MOB declined latching infant or pumping today due to feeling ill. RN educated MOB on the importance of frequent breast stimulation, especially because of the blood loss, but MOB still declined. LC reviewed the same education with MOB. MOB was agreeable to this LC setting up the hospital DEBP, but she still did not want to pump. LC set up the DEBP and reviewed how to use it. LC flange sized MOB's right nipple at an 18mm and the left at a 21mm.  LC reviewed the first 24 hr birthday nap, day 2 cluster feeding, feeding infant on cue 8-12x in 24 hrs, not allowing infant to go over 3 hrs without a feeding, CDC milk storage guidelines, LC services handout and engorgement/breast care. LC encouraged MOB to call for further assistance as needed.  Maternal Data Has patient been taught Hand Expression?: No Does the patient have breastfeeding experience prior to this delivery?: No  Feeding Mother's Current Feeding Choice: Breast Milk and Formula  Lactation Tools Discussed/Used Tools: Pump;Flanges Flange Size: 18;21 (18mm for right nipple and 21mm for left nipple) Breast pump type: Double-Electric Breast Pump;Manual Pump Education: Setup, frequency, and cleaning;Milk Storage Reason for Pumping: blood loss of 2311 Pumping frequency: 15-20 min every 3 hrs  Interventions Interventions: Breast feeding basics reviewed;Hand pump;DEBP;Education;LC Services brochure  Discharge Discharge Education: Engorgement and breast care;Warning signs for feeding baby Pump: DEBP;Hands Free;Personal  Consult Status Consult Status: Follow-up Date: 03/28/24 Follow-up  type: In-patient    Recardo Hoit BS, IBCLC 03/27/2024, 6:18 PM

## 2024-03-28 LAB — CBC
HCT: 29.1 % — ABNORMAL LOW (ref 36.0–46.0)
Hemoglobin: 9.9 g/dL — ABNORMAL LOW (ref 12.0–15.0)
MCH: 30.5 pg (ref 26.0–34.0)
MCHC: 34 g/dL (ref 30.0–36.0)
MCV: 89.5 fL (ref 80.0–100.0)
Platelets: 183 K/uL (ref 150–400)
RBC: 3.25 MIL/uL — ABNORMAL LOW (ref 3.87–5.11)
RDW: 14.9 % (ref 11.5–15.5)
WBC: 12.5 K/uL — ABNORMAL HIGH (ref 4.0–10.5)
nRBC: 0 % (ref 0.0–0.2)

## 2024-03-28 NOTE — Lactation Note (Signed)
 This note was copied from a baby's chart. Lactation Consultation Note  Patient Name: Girl Shamecka Hocutt Unijb'd Date: 03/28/2024 Age:25 hours Reason for consult: Follow-up assessment;1st time breastfeeding Per MOB, she decided to pump and formula feed infant. MOB pumped once today and expressed 3 mls of colostrum. LC suggested MOB to pump every 3 hours to help establish her milk supply. Infant is consuming 25 mls of 20 kcal formula per feeding today. MOB will continue to feed infant by cues, on demand, 8-1 times within 24 hours. MOB knows to call for latch assistance if she decided to latch infant at the breast. MOB knows that her EBM is safe for 4 hours at room temperature whereas formula RTF once open is safe for 1 hour. MOB will continue to pump every 3 hours for 15 minutes and give infant back any EBM first before offering formula.   Maternal Data    Feeding Mother's Current Feeding Choice: Breast Milk and Formula Nipple Type: Slow - flow  LATCH Score                    Lactation Tools Discussed/Used    Interventions Interventions: Skin to skin;DEBP;Education  Discharge    Consult Status Consult Status: Follow-up Date: 03/29/24 Follow-up type: In-patient    Grayce LULLA Batter 03/28/2024, 11:20 PM

## 2024-03-28 NOTE — Progress Notes (Signed)
 POSTPARTUM PROGRESS NOTE  Post Partum Day 1  Subjective:  Heidi Dean is a 24 y.o. G1P1001 s/p SVD at [redacted]w[redacted]d.  She reports she is doing well. No acute events overnight. She denies any problems with ambulating, voiding or po intake. Denies nausea or vomiting.  Pain is well controlled.  Lochia is normal.  Pt did have mild PPH and had JADA which was removed.  No recurrence of bleeding.  Objective: Blood pressure 109/67, pulse 75, temperature 97.6 F (36.4 C), temperature source Oral, resp. rate 17, height 5' 2 (1.575 m), weight 96.2 kg, last menstrual period 05/28/2023, SpO2 99%, unknown if currently breastfeeding.  Physical Exam:  General: alert, cooperative and no distress Chest: no respiratory distress, CTA  bilaterally Heart:regular rate and rhythm Abdomen: soft, nontender, nondistended  Uterine Fundus: firm, appropriately tender DVT Evaluation: No calf swelling or tenderness Extremities: trace edema Skin: warm, dry  Recent Labs    03/27/24 0809 03/28/24 0536  HGB 12.0 9.9*  HCT 34.3* 29.1*    Assessment/Plan: Heidi Dean is a 24 y.o. G1P1001 s/p SVD at [redacted]w[redacted]d   PPD#1 - Doing well  Routine postpartum care Normal lochia Contraception: undecided Feeding: breast if possible Dispo: Anticipate d/c on 03/29/24.   LOS: 3 days   Jerilynn Buddle, MD Faculty attending 03/28/2024, 11:38 AM

## 2024-03-29 ENCOUNTER — Other Ambulatory Visit (HOSPITAL_COMMUNITY): Payer: Self-pay

## 2024-03-29 MED ORDER — WITCH HAZEL-GLYCERIN EX PADS: 1.0000 | MEDICATED_PAD | CUTANEOUS | Status: AC | PRN

## 2024-03-29 MED ORDER — IBUPROFEN 600 MG PO TABS: 600.0000 mg | ORAL_TABLET | Freq: Four times a day (QID) | ORAL | 0 refills | Status: DC

## 2024-03-29 MED ORDER — ACETAMINOPHEN 325 MG PO TABS: 650.0000 mg | ORAL_TABLET | ORAL | 0 refills | Status: DC | PRN

## 2024-03-29 MED ORDER — IBUPROFEN 600 MG PO TABS
600.0000 mg | ORAL_TABLET | Freq: Four times a day (QID) | ORAL | 0 refills | Status: AC
  Filled 2024-03-29: qty 30, 8d supply, fill #0

## 2024-03-29 MED ORDER — DIBUCAINE (PERIANAL) 1 % EX OINT: 1.0000 | TOPICAL_OINTMENT | CUTANEOUS | Status: AC | PRN

## 2024-03-29 MED ORDER — BENZOCAINE-MENTHOL 20-0.5 % EX AERO: 1.0000 | INHALATION_SPRAY | CUTANEOUS | Status: AC | PRN

## 2024-03-29 MED ORDER — ACETAMINOPHEN 325 MG PO TABS
650.0000 mg | ORAL_TABLET | ORAL | 0 refills | Status: AC | PRN
  Filled 2024-03-29: qty 60, 5d supply, fill #0

## 2024-03-29 MED ORDER — COCONUT OIL OIL: 1.0000 | TOPICAL_OIL | Status: AC | PRN

## 2024-03-29 NOTE — Patient Instructions (Signed)

## 2024-03-31 LAB — SURGICAL PATHOLOGY

## 2024-04-04 ENCOUNTER — Telehealth (HOSPITAL_COMMUNITY): Payer: Self-pay | Admitting: *Deleted

## 2024-04-04 ENCOUNTER — Inpatient Hospital Stay (HOSPITAL_COMMUNITY): Admission: RE | Admit: 2024-04-04 | Source: Home / Self Care | Admitting: Family Medicine

## 2024-04-04 ENCOUNTER — Inpatient Hospital Stay (HOSPITAL_COMMUNITY)

## 2024-04-04 NOTE — Telephone Encounter (Addendum)
 04/04/2024  Name: Heidi Dean MRN: [id number] DOB: 07/12/2000  Reason for Call:  Transition of Care Hospital Discharge Call  Contact Status: Patient Contact Status: Complete  Language assistant needed: Interpreter Mode: Telephonic Interpreter Interpreter Name: Charolet [phone]        Follow-Up Questions: Do You Have Any Concerns About Your Health As You Heal From Delivery?: No Do You Have Any Concerns About Your Infants Health?: No  Edinburgh Postnatal Depression Scale:  In the Past 7 Days: I have been able to laugh and see the funny side of things.: As much as I always could I have looked forward with enjoyment to things.: As much as I ever did I have blamed myself unnecessarily when things went wrong.: No, never I have been anxious or worried for no good reason.: No, not at all I have felt scared or panicky for no good reason.: No, not at all Things have been getting on top of me.: No, I have been coping as well as ever I have been so unhappy that I have had difficulty sleeping.: Not at all I have felt sad or miserable.: No, not at all I have been so unhappy that I have been crying.: No, never The thought of harming myself has occurred to me.: Never Edinburgh Postnatal Depression Scale Total: 0  PHQ2-9 Depression Scale:     Discharge Follow-up: Edinburgh score requires follow up?: No  Post-discharge interventions: Reviewed Newborn Safe Sleep Practices  Mliss Sieve, RN 04/04/2024 14:57

## 2024-05-01 ENCOUNTER — Ambulatory Visit: Admitting: Advanced Practice Midwife

## 2024-05-13 ENCOUNTER — Ambulatory Visit: Admitting: Advanced Practice Midwife
